# Patient Record
Sex: Male | Born: 1945 | Race: White | Hispanic: No | Smoking: Never smoker
Health system: Southern US, Community
[De-identification: ages and names within clinical notes are randomized; demographics above are authoritative.]

---

## 2020-04-18 IMAGING — CT CT ABDOMEN AND PELVIS WITH CONTRAST
2 series · 13 of 42 positions shown, 15 images · IV contrast (isovue)
Comparison: None at this facility.

CT ABDOMEN AND PELVIS WITH CONTRAST, 04/18/2020 [DATE]: 
A search for DICOM formatted images was conducted for prior CT imaging studies 
completed at a non-affiliated media free facility. 
CLINICAL INDICATION:  Generalized abdominal pain. History of appendectomy. Prior 
history of intussusception.
TECHNIQUE: The abdomen and pelvis was scanned from lung bases through the pubic 
rami with 100 mL of Isovue 300 on a high-resolution CT scanner using dose 
reduction techniques.  Routine MPR reconstructions were performed. The patient's 
eGFR was calculated to be 94 using the i-STAT device.

[Series 4: abd/pel ax w · axial · 0.90mm/px · z∈[+556,+1003]mm · 10 of 171 slices shown, 12 images]
[im 11/171  soft-tissue]
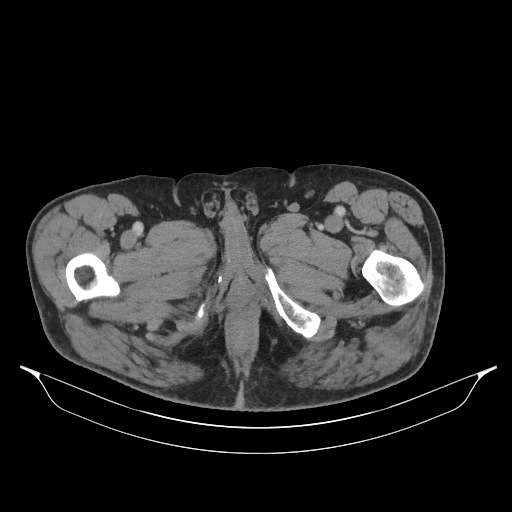
[im 11/171  bone]
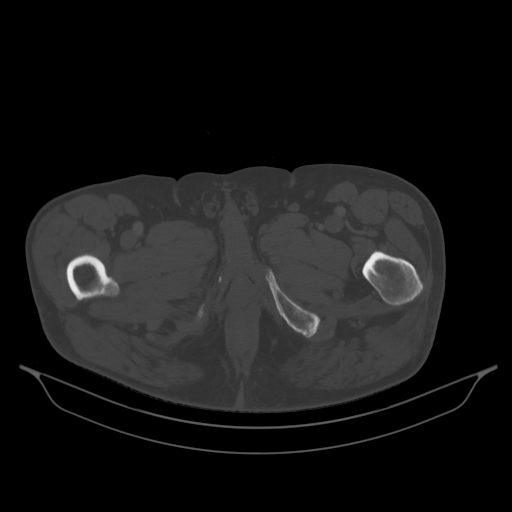
[im 28/171  soft-tissue]
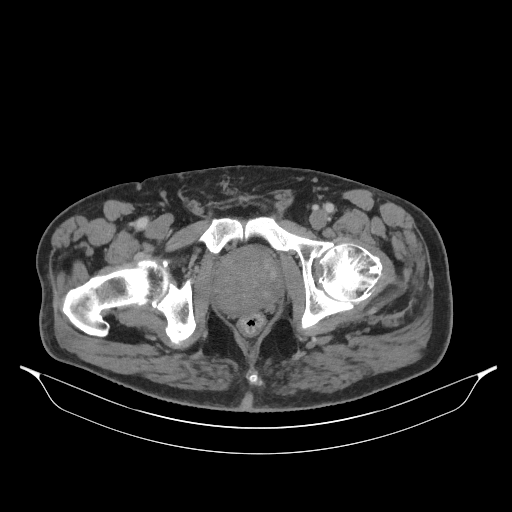
[im 44/171  soft-tissue]
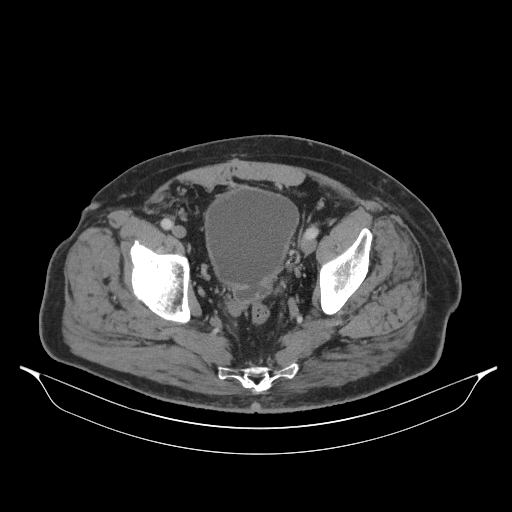
[im 61/171  soft-tissue]
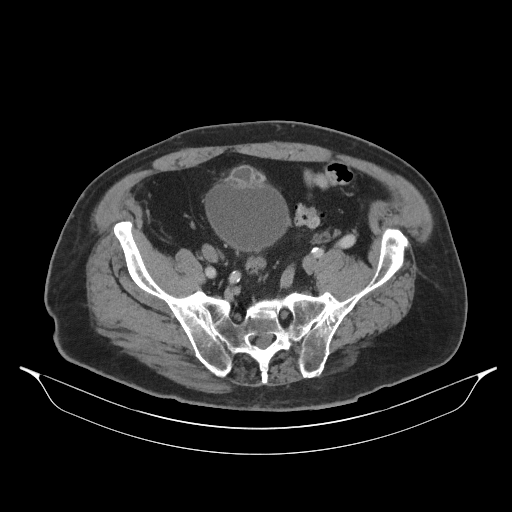
[im 77/171  soft-tissue]
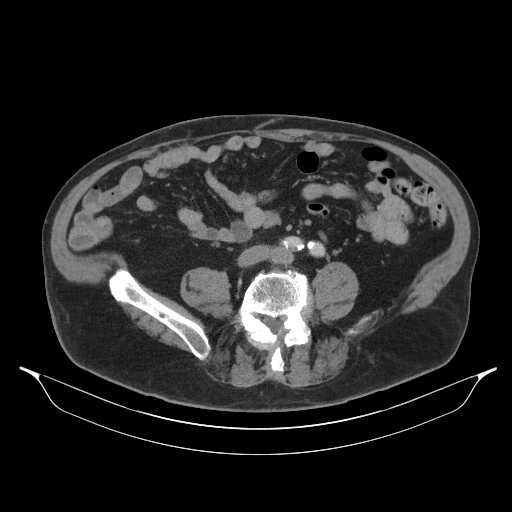
[im 94/171  soft-tissue]
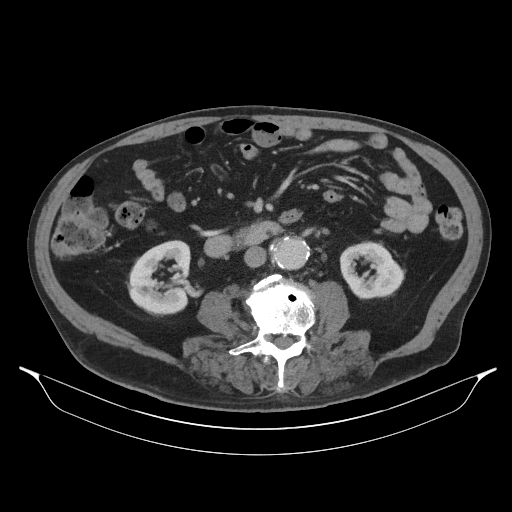
[im 110/171  soft-tissue]
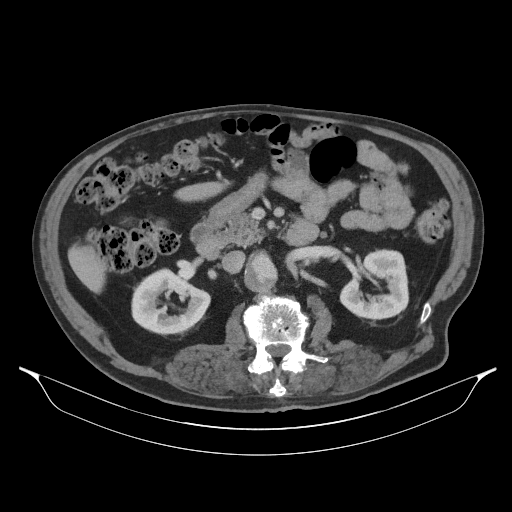
[im 127/171  soft-tissue]
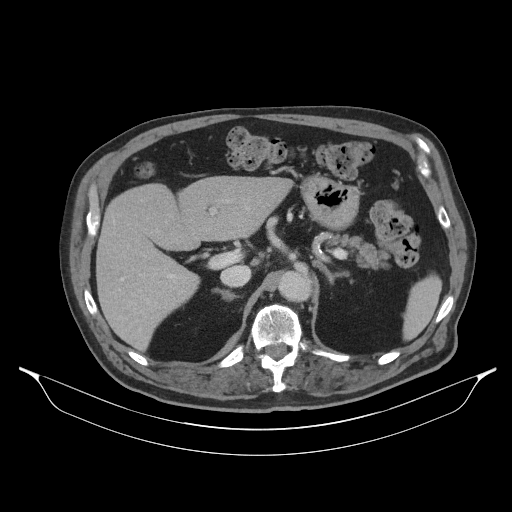
[im 143/171  soft-tissue]
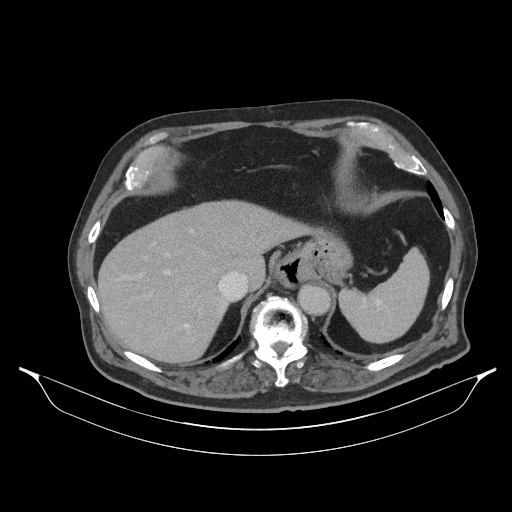
[im 143/171  bone]
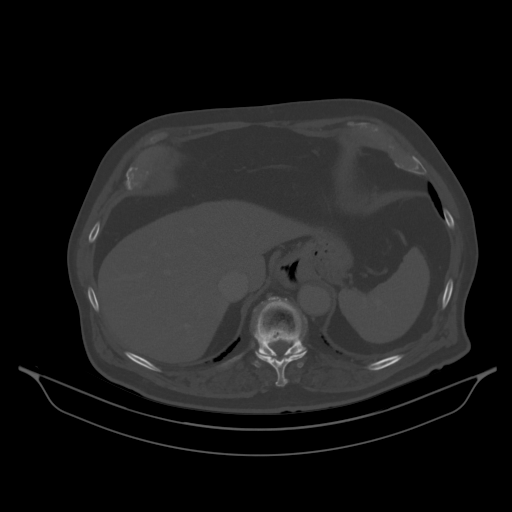
[im 160/171  soft-tissue]
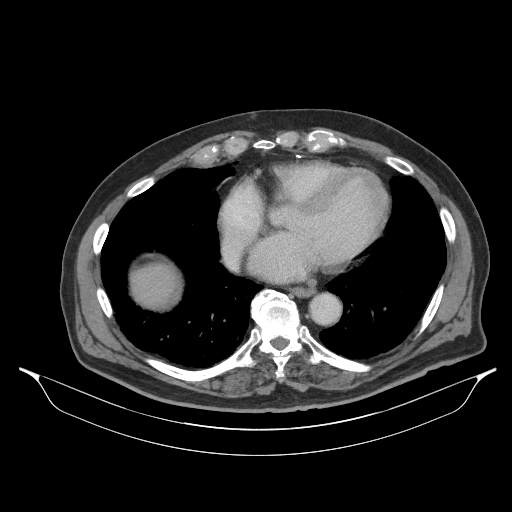

[Series 5: abd/pel cor w · coronal · 0.85mm/px · 3 of 137 slices shown]
[im 46/137  soft-tissue]
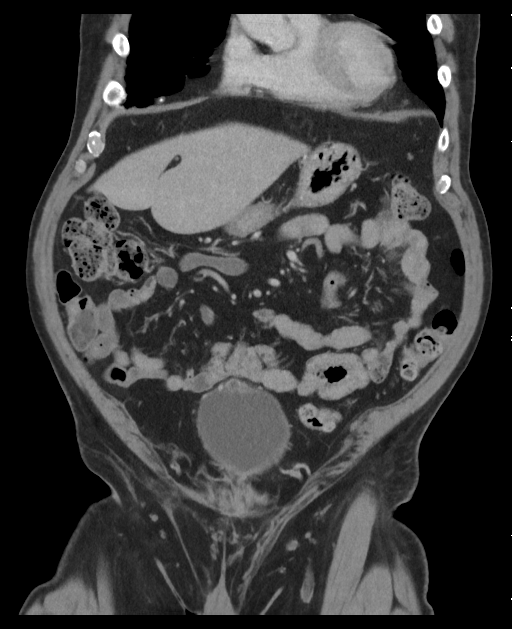
[im 61/137  soft-tissue]
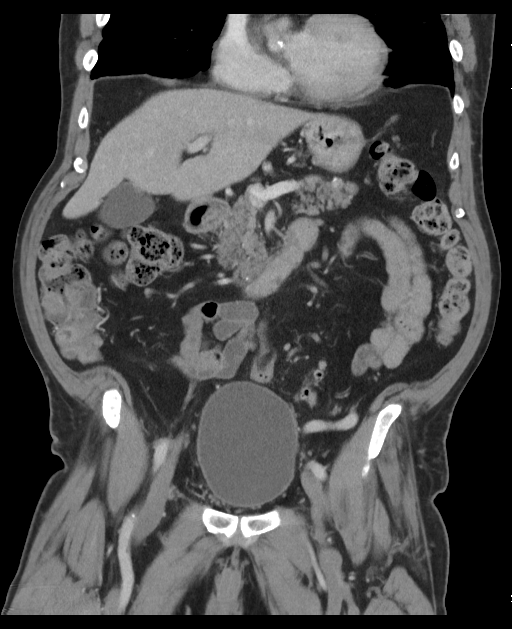
[im 76/137  soft-tissue]
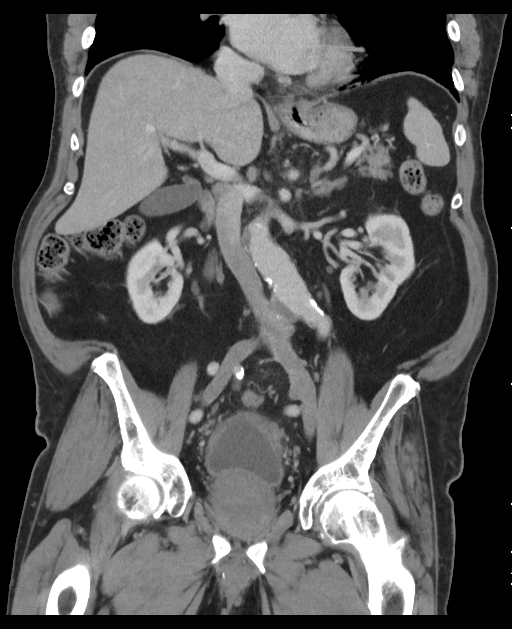

[13 of 42 positions shown; findings below may reference images not displayed]

FINDINGS: Mild diverticulosis is in the distal colon without evidence for diverticulitis. 
There is mildly excessive stool in the colon. The colon is otherwise 
unremarkable. There is a small hiatal hernia. No other gastric pathology is 
found. No abnormalities of the small bowel are detected. The small bowel 
mesentery is within normal limits. The main trunk of the superior mesenteric 
vein and portal vein are patent. There appears to be mild stenosis at the ostium 
of the celiac artery. The superior mesenteric artery shows mild stenosis at its 
ostium and is otherwise widely patent. The IMA appears normally patent. 
The liver appears normal. The gallbladder and biliary tract exhibit no 
abnormalities. The pancreas is normal with the exception of the few punctate 
glandular calcifications in the pancreatic head and uncinate process. The spleen 
is normal. There is mild adrenal thickening and probably a 15 mm adenoma in the 
RIGHT adrenal. There is a 3 mm nonobstructing calculus in the upper pole of the 
RIGHT kidney and a 4 mm nonobstructing calculus also in the lower pole of the 
RIGHT kidney. No LEFT renal calculi are detected. Renal collecting structures 
are decompressed. A subcentimeter cortical cyst is in the anterior midpole 
cortex of the RIGHT kidney. In the LEFT kidney there is a 10 mm isodense bulge 
from the anterior cortex of the lower pole. Statistically this is likely to 
represent a hemorrhagic or proteinaceous cyst however based only on this 
examination a small renal neoplasm is not excluded. The kidneys are otherwise 
unremarkable without acute appearing pathology. Both ureters are decompressed. 
Urinary bladder is normal in overall volume. There are multiple small bladder 
diverticula as well as a 6 mm calculus in one of the posteriorly projecting 
diverticula. The prostate is enlarged measuring 6.3 x 5.2 x 7.4 cm yielding a 
volume of 127 cc. A few dystrophic glandular calcifications are incidentally 
noted in the prostate. Inferior vena cava is normal in caliber. There are 
moderate degrees of atherosclerotic calcification involving the abdominal aorta 
and its branches. There is a small posteriorly projecting saccular aneurysm in 
the infrarenal abdominal aorta measuring 2.5 cm along its base and projecting 16 
mm posteriorly as seen on sagittal image 101. More inferiorly there is a 
rightward projecting saccular aneurysm projecting 19 mm from the aorta and 
measuring 3 cm along its base as seen on coronal image 68. Mild degrees of 
fusiform dilatation are also present measuring up to 3.5 cm. No abnormal 
abdominal or pelvic lymph nodes are detected. There is no free intraperitoneal 
fluid. No significant anterior abdominal wall hernia is identified. Severe 
diffuse degenerative changes are throughout the visible portion of the spine 
with at least moderate and potentially severe degrees of central canal stenosis. 
Limited imaging of the inferior thorax shows cardiomegaly, coronary artery 
calcifications and inferior sternal wires indicating prior heart surgery. There 
are blebs in the posterior base of the RIGHT lower lobe.
IMPRESSION: 1. No acute appearing abdominal or pelvic pathology. There is mildly excessive 
stool in the colon potentially indicating constipation. 
2. There is an indeterminate 10 mm lesion projecting anteriorly from the lower 
pole cortex of LEFT kidney which could represent a hemorrhagic/proteinaceous 
cyst or a small neoplasm. If long-term stability can be proven by comparison to 
prior exams no further evaluation would be needed, otherwise multiphasic 
enhanced CT or MR imaging is recommended for further evaluation. 
3. Nonobstructing RIGHT nephrolithiasis is incidentally noted. The prostate is 
severely enlarged with multiple urinary bladder diverticula and a small urinary 
bladder stone. 
4. Two saccular aneurysms are in the infrarenal abdominal aorta with 
measurements given above. 
5. Severe degenerative changes in the spine with moderate to potentially severe 
degrees of stenosis. 
6. Small hiatal hernia. 
7. Potential small RIGHT adrenal adenoma. 
RADIATION DOSE REDUCTION: All CT scans are performed using radiation dose 
reduction techniques, when applicable.  Technical factors are evaluated and 
adjusted to ensure appropriate moderation of exposure.  Automated dose 
management technology is applied to adjust the radiation doses to minimize 
exposure while achieving diagnostic quality images.

## 2021-10-20 IMAGING — MR MRI ABDOMEN W/WO CONTRAST
14 of 21 series · 24 of 48 positions shown · IV contrast (gadolinium)
Comparison: CT abdomen pelvis April 18, 2020.

________________________________________________________________________________________________ 
MRI ABDOMEN W/WO CONTRAST, 10/20/2021 [DATE]: 
CLINICAL INDICATION: Further evaluation of indeterminate LEFT renal lesion. 
Benign prostatic hyperplasia. Hematuria.
TECHNIQUE: Multiplanar, multiecho position MR images of the abdomen attention 
kidneys were performed without and with intravenous gadolinium enhancement.  9 
mL of Gadavist were injected intravenously. 1 mL of Gadavist was discarded.  
Patient was scanned on a 1.5T magnet. .

[Series 101: survey-head 1st · axial · 15.0mm · 1.76mm/px · 1 of 15 slices shown]
[im 1/15]
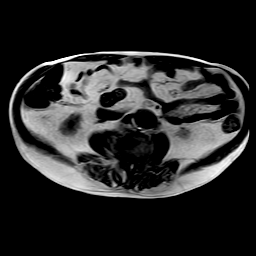

[Series 201: T2 · coronal · 5.0mm · 0.82mm/px · 1 of 32 slices shown]
[im 1/32]
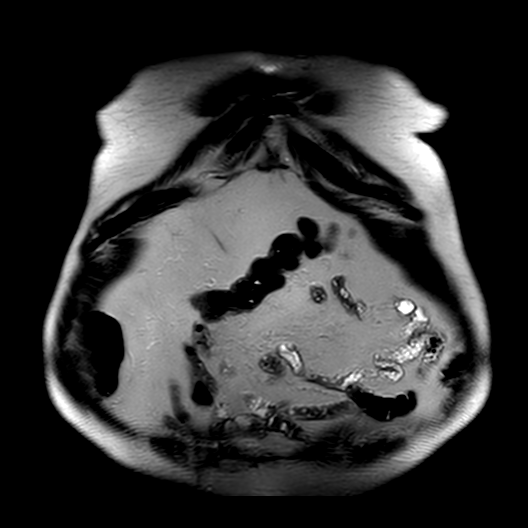

[Series 302: sout of phase · axial · 6.0mm · 1.12mm/px · 1 of 36 slices shown]
[im 1/36]
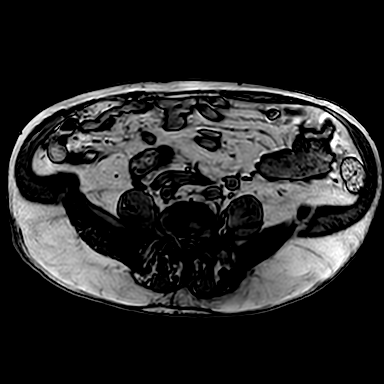

[Series 303: sin phase · axial · 6.0mm · 1.12mm/px · 1 of 36 slices shown]
[im 1/36]
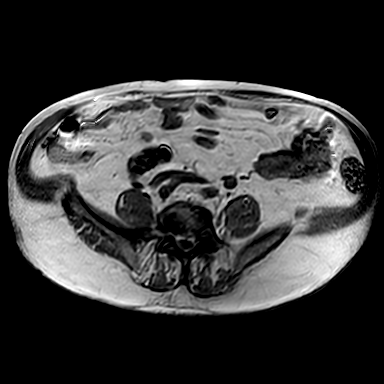

[Series 401: t2_ax_mvxd_hr_rt · axial · 5.0mm · 0.75mm/px · 1 of 40 slices shown]
[im 1/40]
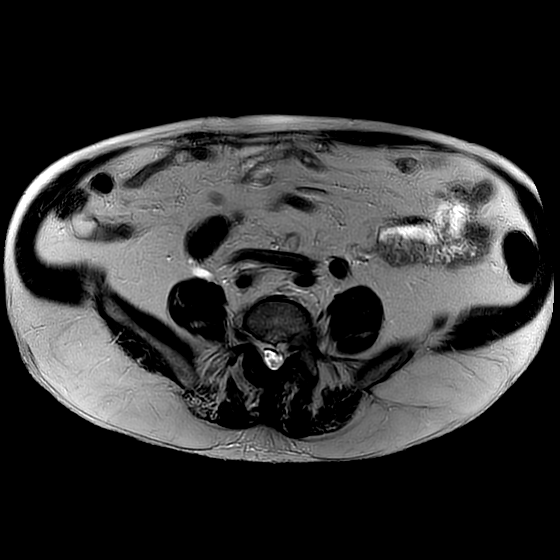

[Series 501: t2_spair mvxd_rt_fast · axial · 5.0mm · 0.88mm/px · 1 of 40 slices shown]
[im 1/40]
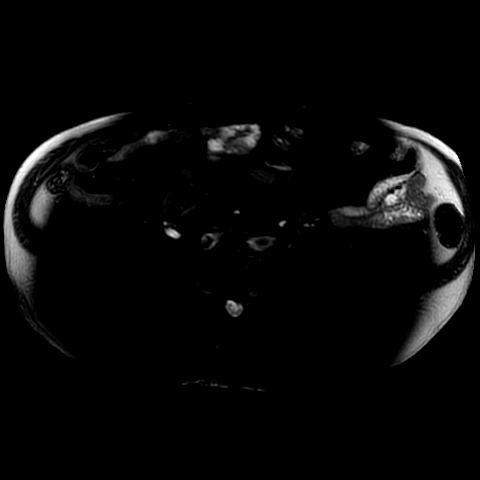

[Series 602: sbo · axial · 5.0mm · 1.68mm/px · 1 of 44 slices shown]
[im 1/44]
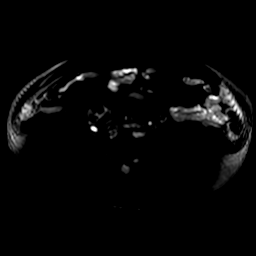

[Series 603: (id) · axial · 5.0mm · 1.68mm/px · 1 of 44 slices shown]
[im 1/44]
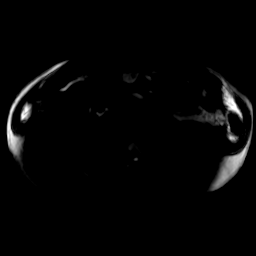

[Series 604: dadc 600 · axial · 5.0mm · 1.68mm/px · 1 of 44 slices shown]
[im 1/44]
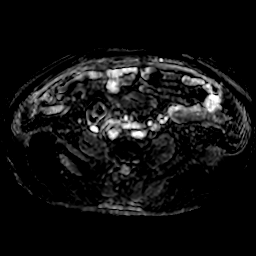

[Series 702: DIXON · axial · 4.0mm · 0.90mm/px · z∈[-94,+144]mm · 3 of 120 slices shown (1 of 5)]
[im 1/120]
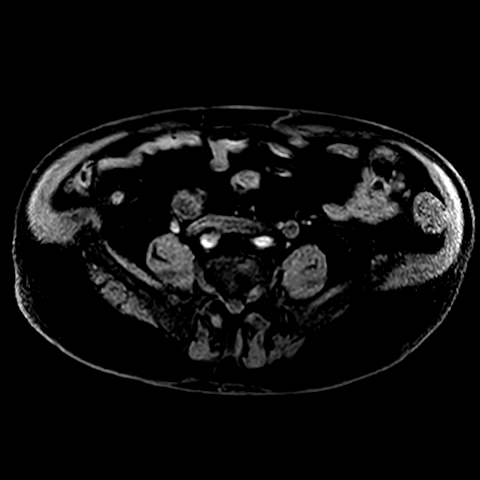
[im 60/120]
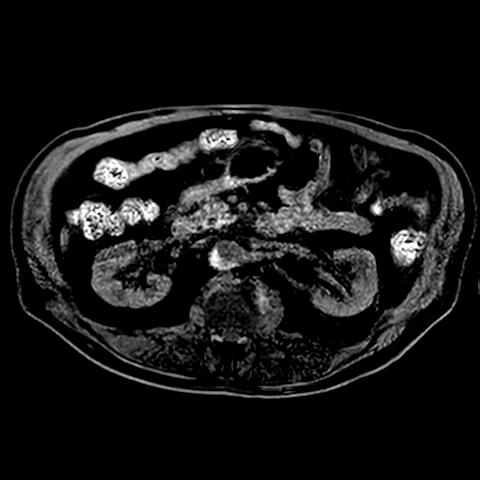
[im 120/120]
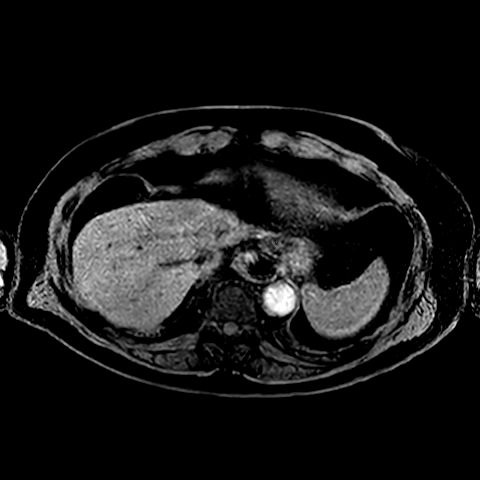

[Series 703: DIXON · axial · 4.0mm · 0.90mm/px · z∈[-94,+144]mm · 3 of 120 slices shown (2 of 5)]
[im 1/120]
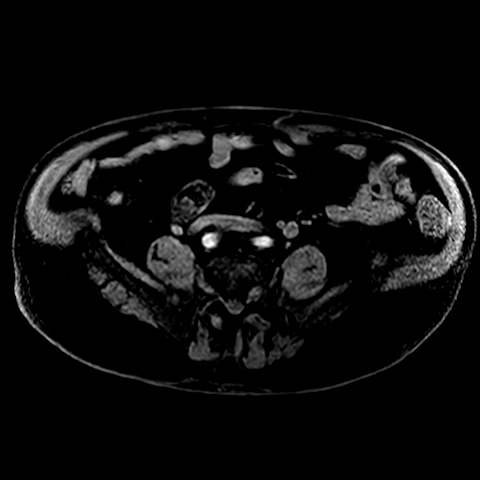
[im 60/120]
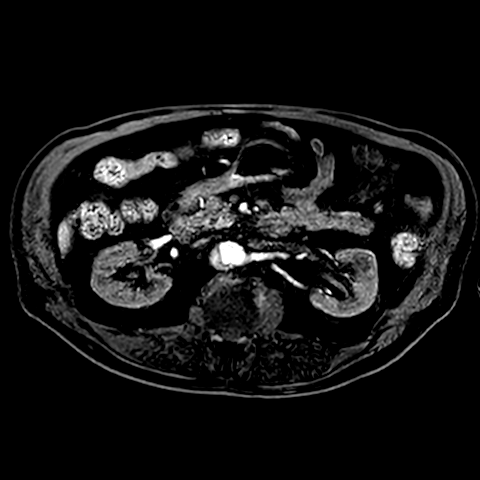
[im 120/120]
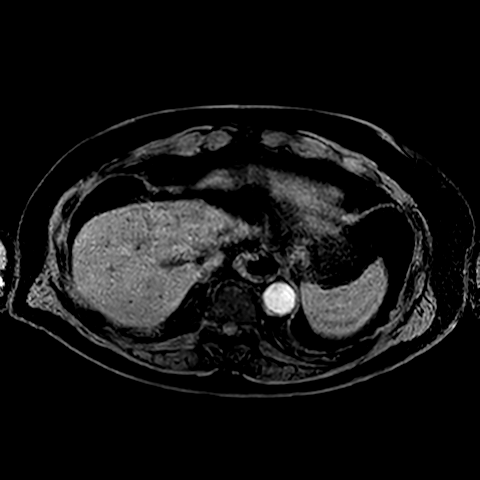

[Series 704: DIXON · axial · 4.0mm · 0.90mm/px · z∈[-94,+144]mm · 3 of 120 slices shown (3 of 5)]
[im 1/120]
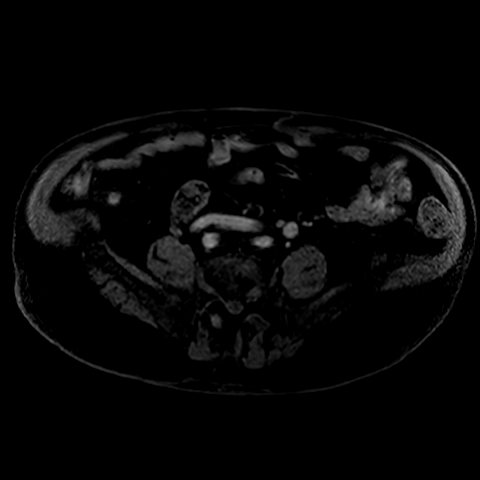
[im 60/120]
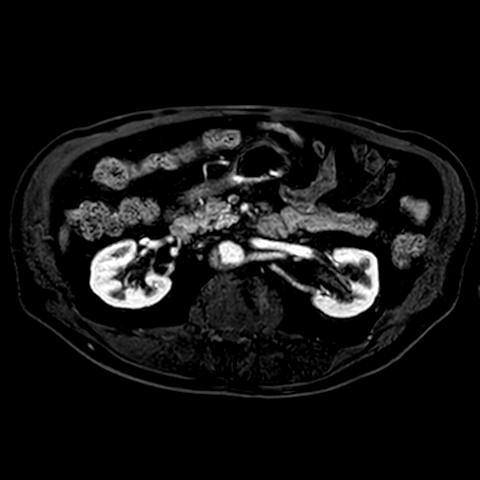
[im 120/120]
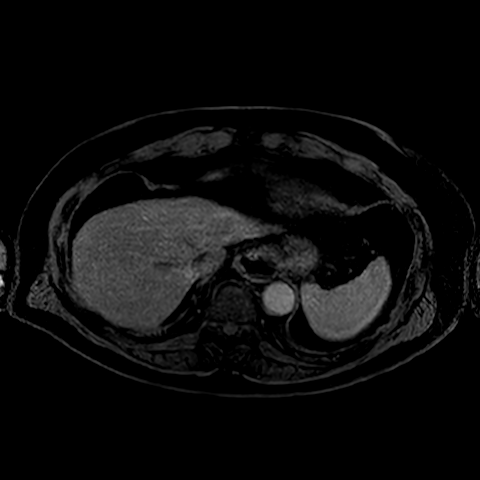

[Series 705: DIXON · axial · 4.0mm · 0.90mm/px · z∈[-94,+144]mm · 3 of 120 slices shown (4 of 5)]
[im 1/120]
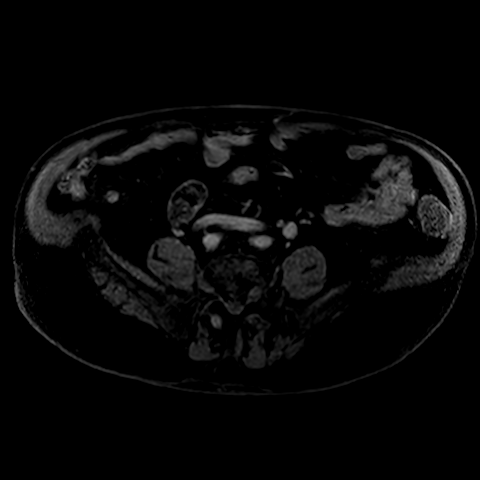
[im 60/120]
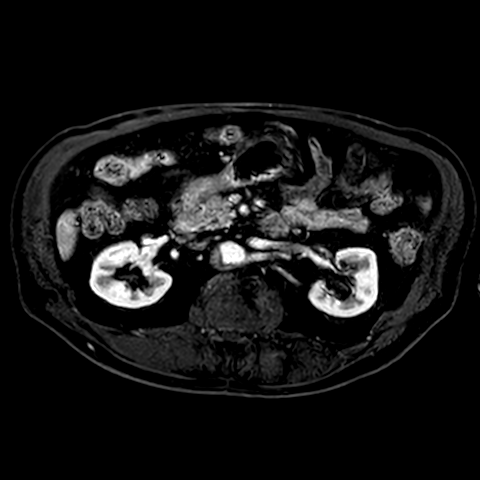
[im 120/120]
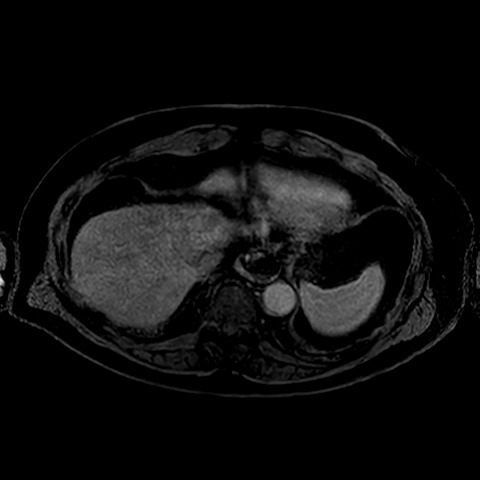

[Series 706: DIXON · axial · 4.0mm · 0.90mm/px · z∈[-94,+64]mm · 3 of 120 slices shown (5 of 5)]
[im 1/120]
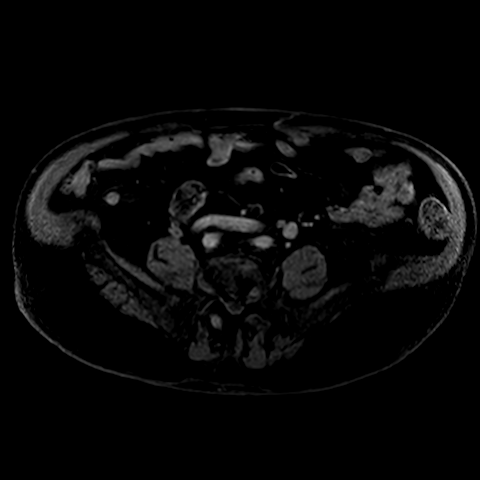
[im 40/120]
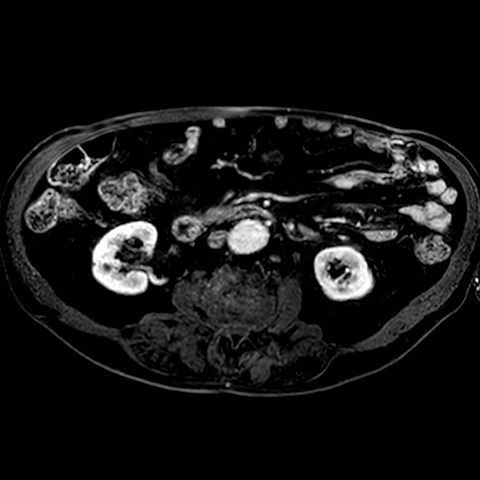
[im 80/120]
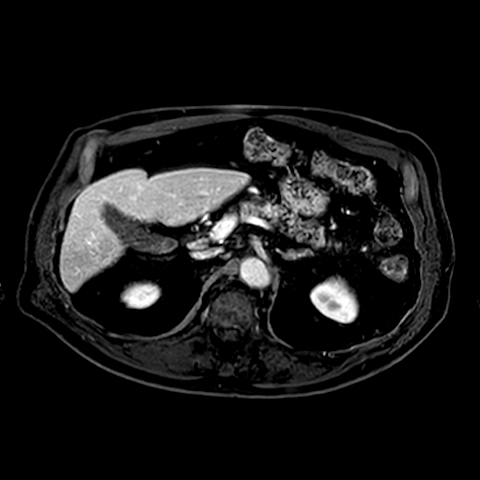

[24 of 48 positions shown; findings below may reference images not displayed]

FINDINGS: There is a 10 mm in maximum dimension bulge along the anterior mid to lower pole 
cortex of the LEFT kidney appearing slightly T2 hyperintense internally showing 
enhancement indicating a small renal mass. It does not appear to have exhibited 
enlargement as compared to the CT performed April 18, 2020. A few small 
bilateral renal cysts are incidentally noted. The kidneys are otherwise 
unremarkable. No hepatic pathology is found. The gallbladder and biliary tract 
are within normal limits. No pancreatic pathology is found. There is a small 
stable RIGHT adrenal adenoma. Left adrenal is within normal limits. There is a 
small hiatal hernia. There is fusiform aneurysmal enlargement of the infrarenal 
abdominal aorta measuring up to 4.4 cm in dimension. Diffuse degenerative 
changes are in the spine. There is mild levoscoliotic curvature in the lumbar 
spine.
IMPRESSION: 1. There is a 10 mm enhancing mass along the anterior cortex of the mid to lower 
pole of the LEFT kidney consistent with a small renal mass. There has been no 
enlargement when compared to April 18, 2020. 
2. Small stable RIGHT adrenal adenoma. 
3. Infrarenal abdominal aortic aneurysm measuring up to 4.4 cm in dimension, 
previously up to 4.1 cm on April 18, 2020. 
4. Small hiatal hernia.

## 2022-02-21 ENCOUNTER — Ambulatory Visit (INDEPENDENT_AMBULATORY_CARE_PROVIDER_SITE_OTHER): Payer: No Typology Code available for payment source | Admitting: Family

## 2022-02-21 ENCOUNTER — Encounter (INDEPENDENT_AMBULATORY_CARE_PROVIDER_SITE_OTHER): Payer: Self-pay

## 2022-02-21 VITALS — BP 116/70 | HR 63 | Temp 97.4°F | Resp 20 | Ht 68.0 in | Wt 200.0 lb

## 2022-02-21 DIAGNOSIS — N3000 Acute cystitis without hematuria: Secondary | ICD-10-CM

## 2022-02-21 LAB — MCKESSON POCT UA 120
Bilirubin UA: NEGATIVE
Glucose UA: NEGATIVE
Ketone UA: NEGATIVE
Nitrite UA: NEGATIVE
Specific Gravity UA: 1.025
Urobilinogen UA: NEGATIVE
pH UA: 6

## 2022-02-21 MED ORDER — CIPROFLOXACIN HCL 250 MG PO TABS
250.0000 mg | ORAL_TABLET | Freq: Two times a day (BID) | ORAL | 0 refills | Status: AC
Start: 2022-02-21 — End: 2022-02-24

## 2022-02-21 NOTE — Progress Notes (Signed)
[facility]  CARE  PROGRESS NOTE     Patient: Jose Campbell   Date: 02/21/2022   MRN: [id number]       Jose Campbell is a 77 y.o. male      HISTORY     History obtained from: Patient    Chief Complaint   Patient presents with    Abdominal Pain     UTI, pain with urinating and frequency        77 y.o male who's currently visiting from out of town presents to the clinic with lower pelvic pain and urinary pressures.  Denies fevers, chills and bodyaches.           Review of Systems   All other systems reviewed and are negative.      History:    Pertinent Past Medical, Surgical, Family and Social History were reviewed.        Current Outpatient Medications:     atorvastatin (LIPITOR) 20 MG tablet, Take 1 tablet (20 mg) by mouth daily, Disp: , Rfl:     valsartan (DIOVAN) 40 MG tablet, Take 1 tablet (40 mg) by mouth daily, Disp: , Rfl:     Allergies   Allergen Reactions    Latex Rash       Medications and Allergies reviewed.    PHYSICAL EXAM     Vitals:    02/21/22 1402   BP: 116/70   BP Site: Right arm   Patient Position: Sitting   Pulse: 63   Resp: 20   Temp: 97.4 F (36.3 C)   TempSrc: Tympanic   SpO2: 96%   Weight: 90.7 kg (200 lb)   Height: 1.727 m ('5\' 8"'$ )       Physical Exam  Constitutional:       General: He is not in acute distress.     Appearance: Normal appearance. He is well-developed.   HENT:      Head: Normocephalic and atraumatic.     Eyes: Conjunctivae are normal. No scleral icterus. Cardiovascular:      Rate and Rhythm: Normal rate.   Pulmonary:      Effort: Pulmonary effort is normal.   Abdominal:      General: Bowel sounds are normal. There is no distension.      Palpations: Abdomen is soft.      Tenderness: There is abdominal tenderness.   Neurological:      Mental Status: He is alert and oriented to person, place, and time.   Skin:     General: Skin is warm.      Coloration: Skin is not pale.   Vitals and nursing note reviewed.         UCC COURSE     There were no labs reviewed with this patient during  the visit.    There were no x-rays reviewed with this patient during the visit.    No current facility-administered medications for this visit.       PROCEDURES     Procedures    MEDICAL DECISION MAKING     History, physical, labs/studies most consistent with UTI as the diagnosis.    Chart Review:  Prior PCP, Specialist and/or ED notes reviewed today: No  Prior labs/images/studies reviewed today: No    Differential Diagnosis: UTI, kidney stone, pyelonephritis    ASSESSMENT     Encounter Diagnosis   Name Primary?    Acute cystitis without hematuria Yes            PLAN  PLAN:       Increase fluid intake     Macrobid prescribed x5 days     Take OTC Azo as needed for symptom management     Urine culture sent for verification     Assessment     Jose Campbell was seen today for abdominal pain.    Diagnoses and all orders for this visit:    Acute cystitis without hematuria  -     UA  -     Urine culture    Other orders  -     ciprofloxacin (CIPRO) 250 MG tablet; Take 1 tablet (250 mg) by mouth 2 (two) times daily for 3 days           Orders Placed This Encounter   Procedures    UA     Requested Prescriptions      No prescriptions requested or ordered in this encounter       Discussed results and diagnosis with patient/family.  Reviewed warning signs for worsening condition, as well as, indications for follow-up with primary care physician and return to urgent care clinic.   Patient/family expressed understanding of instructions.     An After Visit Summary was provided to the patient.

## 2022-02-24 ENCOUNTER — Ambulatory Visit (INDEPENDENT_AMBULATORY_CARE_PROVIDER_SITE_OTHER): Payer: No Typology Code available for payment source | Admitting: Family

## 2022-02-24 ENCOUNTER — Encounter (INDEPENDENT_AMBULATORY_CARE_PROVIDER_SITE_OTHER): Payer: Self-pay

## 2022-02-24 VITALS — BP 162/86 | HR 66 | Temp 98.4°F | Resp 14 | Ht 68.0 in | Wt 201.0 lb

## 2022-02-24 DIAGNOSIS — R309 Painful micturition, unspecified: Secondary | ICD-10-CM

## 2022-02-24 DIAGNOSIS — N3001 Acute cystitis with hematuria: Secondary | ICD-10-CM

## 2022-02-24 DIAGNOSIS — L0291 Cutaneous abscess, unspecified: Secondary | ICD-10-CM

## 2022-02-24 DIAGNOSIS — B353 Tinea pedis: Secondary | ICD-10-CM

## 2022-02-24 LAB — MCKESSON POCT UA 120
Bilirubin UA: NEGATIVE
Glucose UA: NEGATIVE
Ketone UA: NEGATIVE
Leukocytes UA: NEGATIVE
Nitrite UA: NEGATIVE
Specific Gravity UA: 1.02
Urobilinogen UA: NEGATIVE
pH UA: 6

## 2022-02-24 MED ORDER — CLOTRIMAZOLE 1 % EX CREA
TOPICAL_CREAM | Freq: Two times a day (BID) | CUTANEOUS | 0 refills | Status: AC
Start: 2022-02-24 — End: 2022-03-26

## 2022-02-24 MED ORDER — CEFDINIR 300 MG PO CAPS
300.0000 mg | ORAL_CAPSULE | Freq: Two times a day (BID) | ORAL | 0 refills | Status: AC
Start: 2022-02-24 — End: 2022-03-03

## 2022-02-24 NOTE — Progress Notes (Signed)
[facility]  URGENT  CARE  PROGRESS NOTE     Patient: Jose Campbell   Date: 02/24/2022   MRN: [id number]       Jose Campbell is a 77 y.o. male      HISTORY     History obtained from: Patient    Chief Complaint   Patient presents with    Urinary Tract Infection Symptoms     Onset - seen Friday for UTI, symptoms still present    Wound Check     Onset - yesterday, scar on chest irritation, rash on left foot        HPI: 77 year old male history of hypertension, high cholesterol presents to urgent care clinic with 3 complaints.    Initial complaint involves urinary burning, frequency, and urgency for the last week.  Was seen at this clinic 3 days ago and diagnosed with UTI.  Was prescribed Cipro for 3 days which he completed but states symptoms still persist.  Denies any flank pain, abdominal pain, fever, testicular pain, penile lesions, penile discharge.    Second complaint involves area of redness and swelling to left chest area.  Has been also ongoing for a week and started draining purulent drainage today.  Denies any chest pain, shortness of breath, or history of diabetes.    Third complaint involves itchy rash to left foot that has been ongoing for 2 weeks.  Rash appears scaly and appears to have dry skin to area.  Denies any discharge, numbness, tingling, extremity weakness, or injury to the foot.    Review of Systems  as above    History:    Pertinent Past Medical, Surgical, Family and Social History were reviewed.        Current Outpatient Medications:     atorvastatin (LIPITOR) 20 MG tablet, Take 1 tablet (20 mg) by mouth daily, Disp: , Rfl:     valsartan (DIOVAN) 40 MG tablet, Take 1 tablet (40 mg) by mouth daily, Disp: , Rfl:     cefdinir (OMNICEF) 300 MG capsule, Take 1 capsule (300 mg) by mouth 2 (two) times daily for 7 days, Disp: 14 capsule, Rfl: 0    ciprofloxacin (CIPRO) 250 MG tablet, Take 1 tablet (250 mg) by mouth 2 (two) times daily for 3 days (Patient not taking: Reported on 02/24/2022), Disp: 6 tablet,  Rfl: 0    clotrimazole (LOTRIMIN) 1 % cream, Apply topically 2 (two) times daily, Disp: 85 g, Rfl: 0    Allergies   Allergen Reactions    Sulfa Antibiotics Hives    Latex Rash       Medications and Allergies reviewed.    PHYSICAL EXAM     Vitals:    02/24/22 0858   BP: 162/86   Pulse: 66   Resp: 14   Temp: 98.4 F (36.9 C)   SpO2: 99%   Weight: 91.2 kg (201 lb)   Height: 1.727 m ('5\' 8"'$ )       Physical Exam  Constitutional:       General: He is not in acute distress.     Appearance: Normal appearance. He is well-developed.   HENT:      Head: Normocephalic and atraumatic.      Nose: Nose normal.      Mouth/Throat:      Mouth: Mucous membranes are moist.      Pharynx: No oropharyngeal exudate.     Eyes: Conjunctivae and EOM are normal. Pupils are equal, round, and reactive to light. No scleral  icterus. Neck:      Thyroid: No thyroid mass or thyromegaly.      Vascular: No carotid bruit.   Cardiovascular:      Rate and Rhythm: Normal rate and regular rhythm.      Pulses: Normal pulses.      Heart sounds: Normal heart sounds. No murmur heard.  Pulmonary:      Effort: Pulmonary effort is normal. No respiratory distress.      Breath sounds: Normal breath sounds. No wheezing.   Abdominal:      General: Bowel sounds are normal. There is no distension.      Palpations: Abdomen is soft. There is no mass.      Tenderness: There is no abdominal tenderness. There is no right CVA tenderness or left CVA tenderness.   Musculoskeletal:         General: Normal range of motion.      Cervical back: Normal range of motion and neck supple.      Right lower leg: Normal.      Left lower leg: Normal.      Right ankle: Normal.      Left ankle: Normal.      Right foot: Normal.      Left foot: No swelling, deformity or tenderness.      Comments: Dry, hyperkeratotic patches noted to dorsum of patient's left foot and areas between toes.  No overt tenderness, warmth, lymphatic streaking, induration, vesicles, purpura, or petechia noted.  Appears  to be tinea pedis.   Lymphadenopathy:      Cervical: No cervical adenopathy.   Neurological:      Mental Status: He is alert and oriented to person, place, and time.      Cranial Nerves: No cranial nerve deficit.      Sensory: No sensory deficit.      Gait: Gait normal.      Deep Tendon Reflexes: Reflexes are normal and symmetric.   Skin:     General: Skin is warm and dry.      Findings: Rash present.   Psychiatric:         Mood and Affect: Mood normal.         Speech: Speech normal.         Behavior: Behavior normal.   Chest:      Chest wall: Tenderness present.          Comments: 1 cm diameter erythematous, indurated, papular lesion noted to left chest.  Negative for lymphatic streaking.  Draining slightly purulent drainage.  Vitals and nursing note reviewed.          UCC COURSE     LABS  The following POCT tests were ordered, reviewed and discussed with the patient/family.     Results       Procedure Component Value Units Date/Time    UA [id number]  (Abnormal) Collected: 02/24/22 0915    Specimen: Clean Catch Updated: 02/24/22 0915     Color, UA Yellow     Clarity, UA Clear     Leukocytes UA Negative     Nitrite UA Negative     Urobilinogen UA Negative (0.2 mg/dl)     Protein UA Trace ('15mg'$ /dl)     pH UA 6.0     Blood UA Trace (10 Ery/ul)     Specific Gravity UA 1.020     Ketone UA Negative     Bilirubin UA Negative     Glucose UA Negative  There were no x-rays reviewed with this patient during the visit.    No current facility-administered medications for this visit.       PROCEDURES     Procedures    MEDICAL DECISION MAKING     History, physical, labs/studies most consistent with acute UTI / tinea pedis / skin abscess as the diagnosis.        Chart Review:  Prior PCP, Specialist and/or ED notes reviewed today: Yes  Prior labs/images/studies reviewed today: Yes    Differential Diagnosis: UTI, pyelonephritis, renal colic, prostatitis, appendicitis, diverticulitis, bladder CA, STI, tinea pedis,  cellulitis, eczema, psoriasis, skin abscess, cellulitis        ASSESSMENT     Encounter Diagnoses   Name Primary?    Painful urination     Acute cystitis with hematuria Yes    Cutaneous abscess, unspecified site     Tinea pedis of left foot                 PLAN      PLAN: VSS, afebrile, nontachy, appears to be in no acute distress, lung sounds clear, abdomen soft and nontender palpation.  TM's pearly gray, oropharynx nonerythematous and not exudative.  Is nontoxic-appearing.    Erythematous papular lesion to left chest appears to be skin abscess that is already draining.  Area further drained with manual pressure and patient tolerated procedure well.  Advised warm compresses to area.    Rash to left foot appears to be from tinea pedis.  Concern for cellulitis is low.  Will patient a course of clotrimazole cream to help with symptoms.    Urinalysis still indicative of UTI slight hematuria and urine.  Urine culture repeated.  Chart review shows no growth from previous urinalysis.  Will put patient a course of cefdinir and strongly encourage follow-up with PCP regarding symptoms.     Cefdinir will also cover for skin abscess to left chest.    Strict ER precautions discussed.  Advised follow up with PCP.  Indicated understanding and agreed to treatment plan.              Orders Placed This Encounter   Procedures    Urine culture    UA     Requested Prescriptions     Signed Prescriptions Disp Refills    cefdinir (OMNICEF) 300 MG capsule 14 capsule 0     Sig: Take 1 capsule (300 mg) by mouth 2 (two) times daily for 7 days    clotrimazole (LOTRIMIN) 1 % cream 85 g 0     Sig: Apply topically 2 (two) times daily       Discussed results and diagnosis with patient/family.  Reviewed warning signs for worsening condition, as well as, indications for follow-up with primary care physician and return to urgent care clinic.   Patient/family expressed understanding of instructions.     An After Visit Summary was provided to the  patient.

## 2022-02-24 NOTE — Patient Instructions (Signed)
You were seen in clinic today for an acute urinary tract infection.  Please take antibiotics as prescribed.  Please take over-the-counter Tylenol as needed for discomfort.  Rest, home, fluids.    You had a skin abscess to your chest that was already draining.  Apply warm compresses to area to further help with symptoms.    Rash to your foot appears to be from athlete's foot.  Please take clotrimazole cream as instructed.    Please return or go to the ER for any new or worsening symptoms that concern you.  Follow-up with your family doctor in 3 to 5 days.

## 2022-04-28 IMAGING — CT CTA ABDOMEN WITH BILATERAL RUNOFF
3 of 4 series · 13 of 32 positions shown, 18 images · IV contrast (APPLIED)
Comparison: CT 04/18/2020.

________________________________________________________________________________________________ 
CTA ABDOMEN WITH BILATERAL RUNOFF, 04/28/2022 [DATE]: 
CLINICAL INDICATION: Abdominal aortic aneurysm. 
A search for DICOM formatted images was conducted for prior CT imaging studies 
completed at a non-affiliated media free facility.
TECHNIQUE: The lung bases through the feet were scanned with 150 mL of Isovue 
370 MDV intravenously on a high-resolution CT scanner using dose reduction 
techniques. 0 ml of  Isovue 370 MDV were discarded. Routine MPR and MIP 3D 
renderings were reconstructed on an independent workstation with concurrent 
physician supervision. The patients eGFR was calculated to be 93.5 mL/min/1.73 
m2 using the i-STAT device. Count of known CT and Cardiac Nuclear Medicine 
studies performed in the previous 12 months = 0.

[Series 4: runoff 1.5 b31s · axial · 0.93mm/px · z∈[-1275,-216]mm · 7 of 942 slices shown, 12 images (1 of 2)]
[im 118/942  soft-tissue]
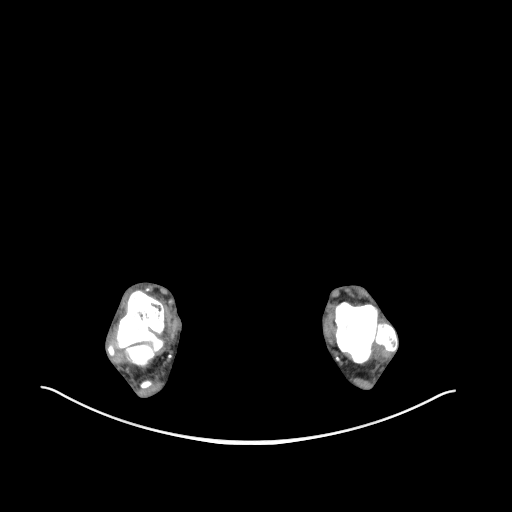
[im 118/942  bone]
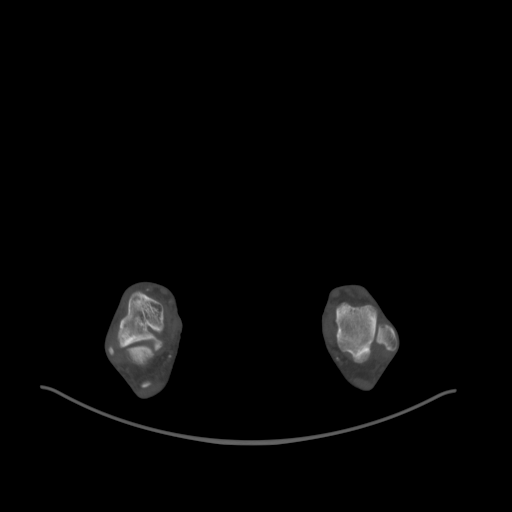
[im 236/942  soft-tissue]
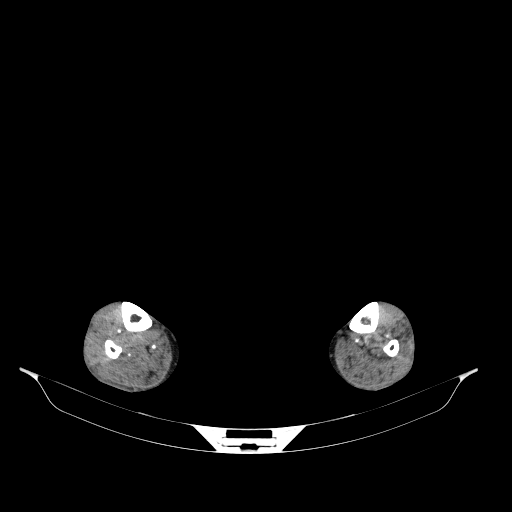
[im 353/942  soft-tissue]
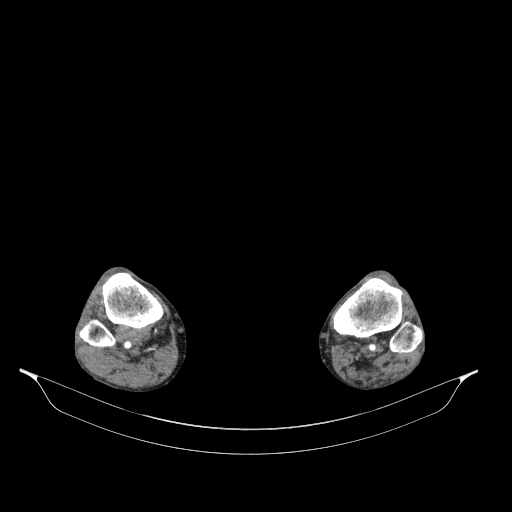
[im 471/942  soft-tissue]
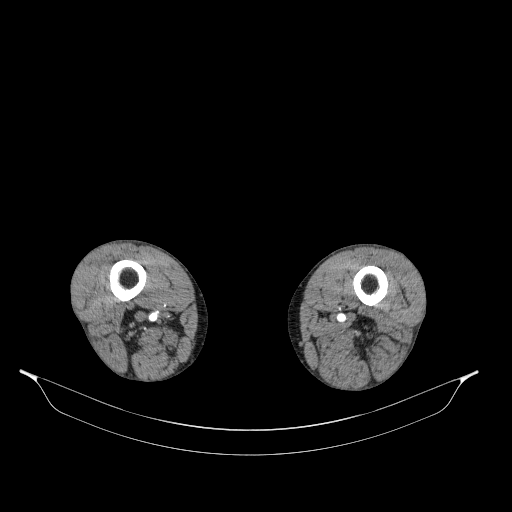
[im 471/942  lung]
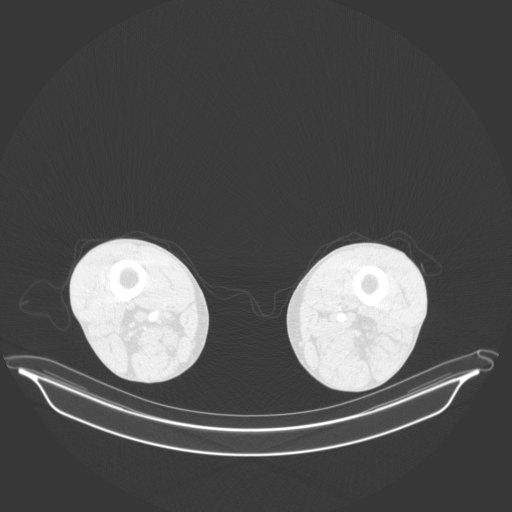
[im 589/942  soft-tissue]
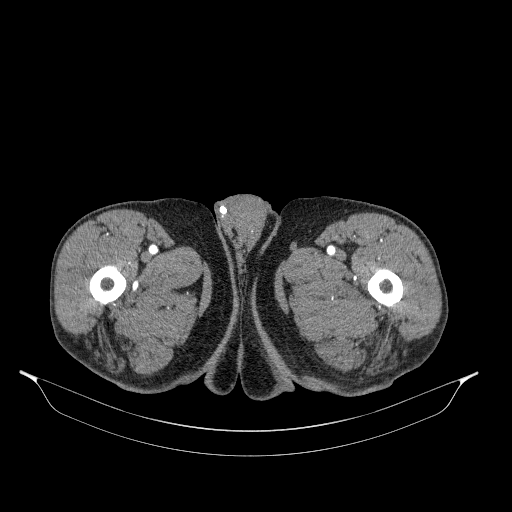
[im 589/942  lung]
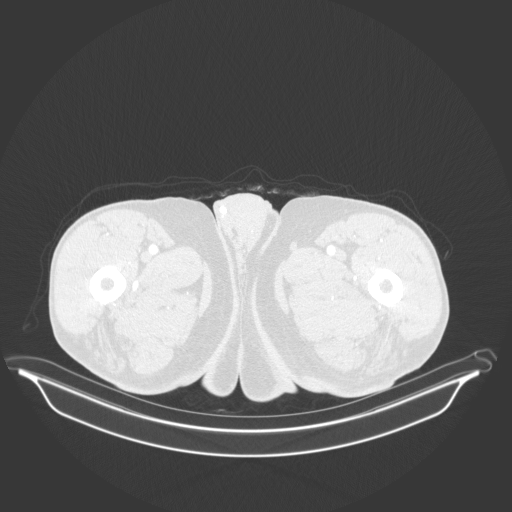
[im 706/942  soft-tissue]
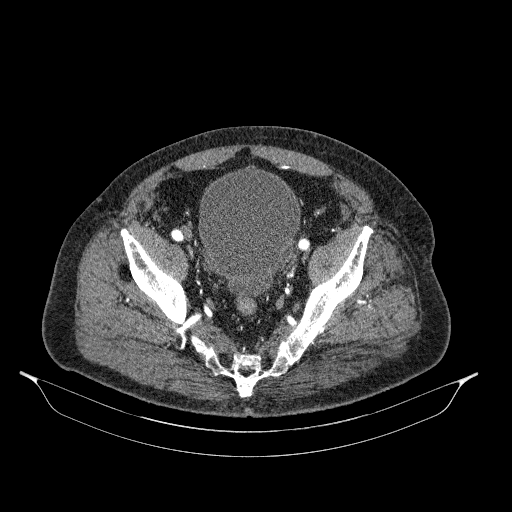
[im 706/942  lung]
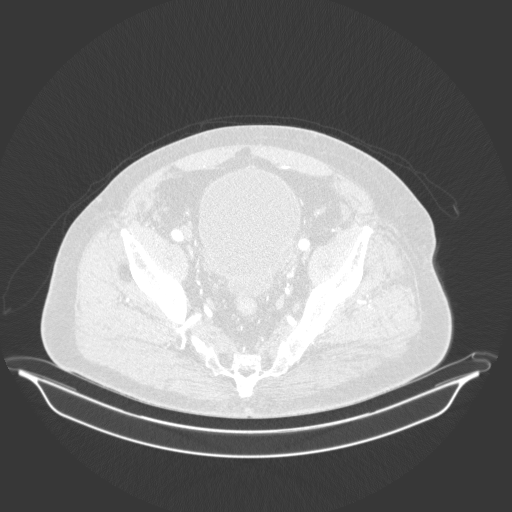
[im 824/942  soft-tissue]
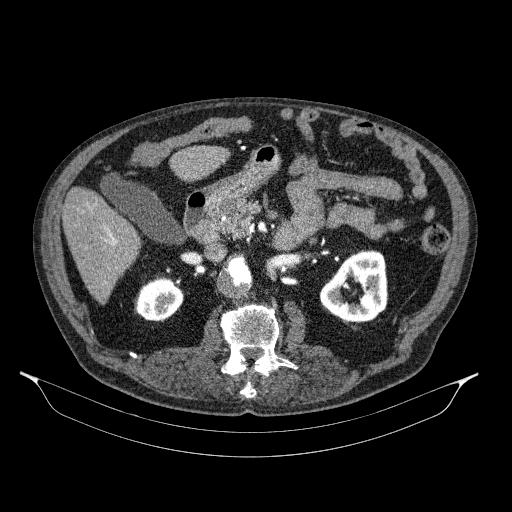
[im 824/942  lung]
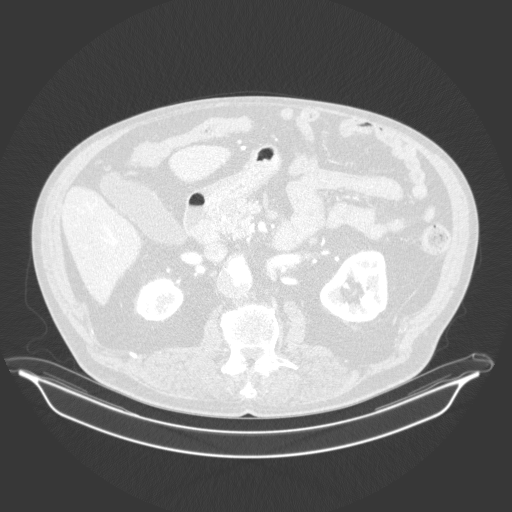

[Series 8: runoff 1.5 b31s · axial · 0.93mm/px · z∈[-1226,-1001]mm · 2 of 452 slices shown (2 of 2)]
[im 151/452  soft-tissue]
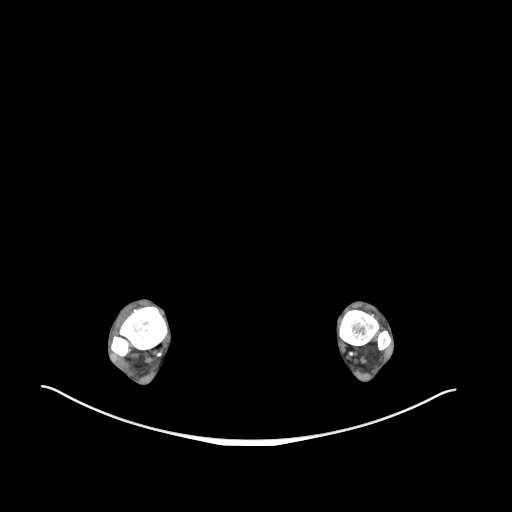
[im 301/452  soft-tissue]
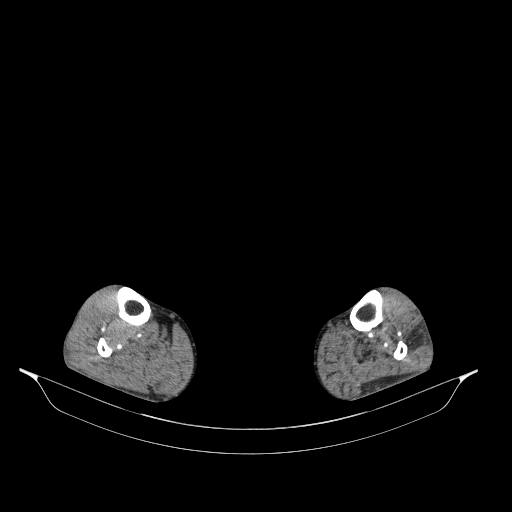

[Series 11: vitrea · axial · 0.93mm/px · z∈[-1316,-910]mm · 4 of 678 slices shown]
[im 136/678  soft-tissue]
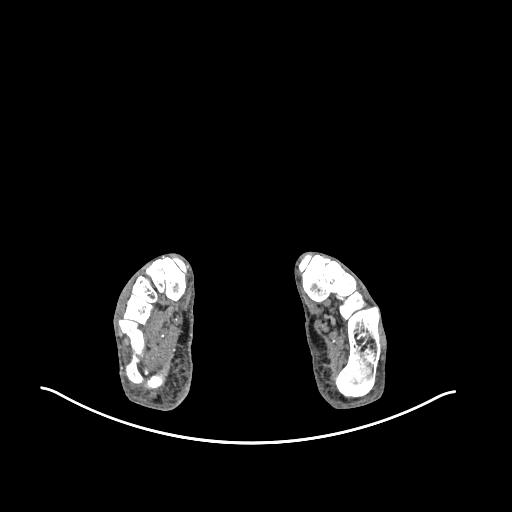
[im 271/678  soft-tissue]
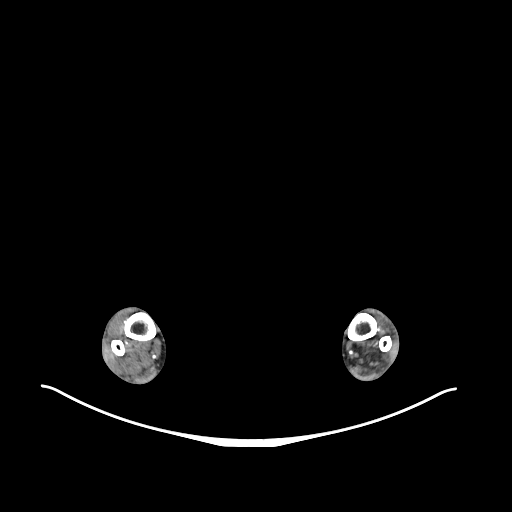
[im 407/678  soft-tissue]
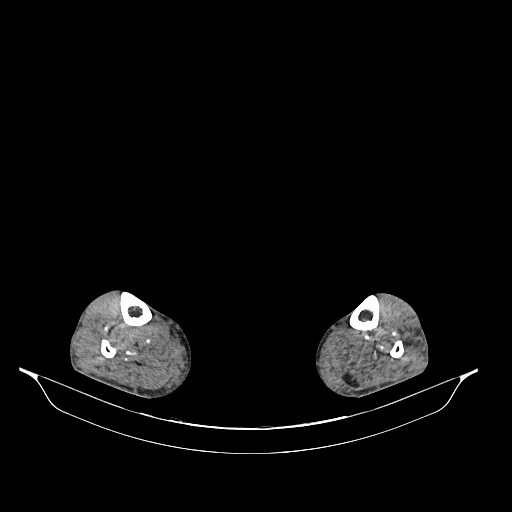
[im 542/678  soft-tissue]
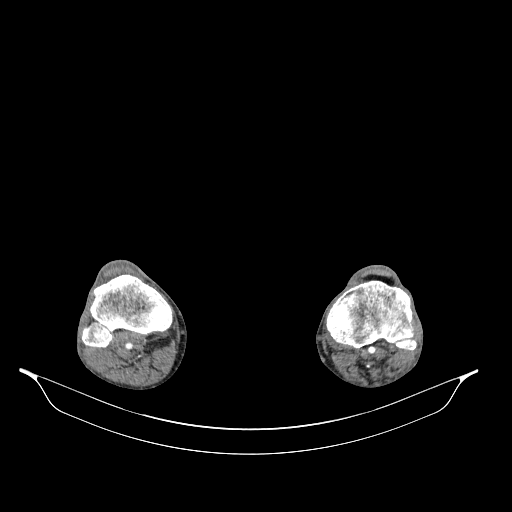

[13 of 32 positions shown; findings below may reference images not displayed]

FINDINGS: ABDOMINAL AORTA/ILIOFEMORAL/RUNOFF: Abdominal aortic aneurysm measuring 
approximately 4.4 cm. No dissection. The aorta is ectatic in course. Iliofemoral 
vessels are ectatic in course without significant focal stenosis or evidence of 
aneurysm. Mild atherosclerotic plaque is noted within a superficial femoral and 
popliteal arteries but no significant focal stenosis. There is three-vessel 
runoff bilaterally with posterior tibial arteries being the dominant runoff 
vessels.  
MESENTERIC ARTERIES: No significant mesenteric stenosis or aneurysm. 
RENAL ARTERIES: No significant renal artery stenosis or aneurysm. 
LUNG BASES: Clear. 
LIVER: Fatty infiltration. No mass.  No intrahepatic biliary dilatation. 
GALLBLADDER: No wall thickening.  No stones. 
COMMON BILE DUCT: Normal caliber.  No stones. 
SPLEEN: Within normal limits. 
PANCREAS: No mass.  No pancreatic fluid collections. 
ADRENALS: Right adrenal adenoma. No left adrenal mass.. 
KIDNEYS: Stable 10 mm enhancing mass anterior cortex junction midportion lower 
pole left kidney. No new renal masses..  No hydronephrosis. 
LYMPH NODES: No adenopathy. 
STOMACH, SMALL BOWEL AND COLON: No bowel wall thickening or obstruction. 
PERITONEAL CAVITY: No mesenteric stranding or free fluid. 
OSSEOUS STRUCTURES: No acute fracture or destructive lesion. 
PELVIC ORGANS: Stable prostatomegaly and mild bladder wall thickening..
IMPRESSION: Stable infrarenal abdominal aortic aneurysm measuring 4.4 cm in its greatest 
dimension. 
No significant focal iliofemoral runoff stenosis. No iliofemoral aneurysm. 
Stable small enhancing mass left kidney. 
Stable right adrenal adenoma  
RADIATION DOSE REDUCTION: All CT scans are performed using radiation dose 
reduction techniques, when applicable.  Technical factors are evaluated and 
adjusted to ensure appropriate moderation of exposure.  Automated dose 
management technology is applied to adjust the radiation doses to minimize 
exposure while achieving diagnostic quality images.

## 2022-09-11 IMAGING — MR MRI ABDOMEN W/WO CONTRAST
14 of 21 series · 22 of 48 positions shown · IV contrast (gadavist)
Comparison: MRI from October 2021 and CT scan from April 2022

________________________________________________________________________________________________ 
MRI ABDOMEN W/WO CONTRAST, 09/11/2022 [DATE]: 
CLINICAL INDICATION: Follow-up left renal lesion.
TECHNIQUE: Multiplanar, multiecho position MR images of the abdomen were 
performed without and with intravenous enhancement. 9 mL of Gadavist were 
injected intravenously by hand. 1 mL of Gadavist discarded. Patient was scanned 
on a 1.5T magnet.

[Series 101: survey-head 1st · axial · 15.0mm · 1.76mm/px · 1 of 15 slices shown]
[im 1/15]
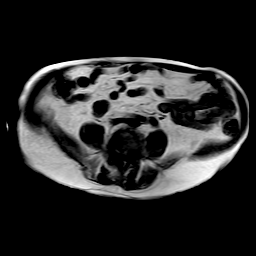

[Series 201: T2 · coronal · 5.5mm · 0.66mm/px · 1 of 34 slices shown]
[im 1/34]
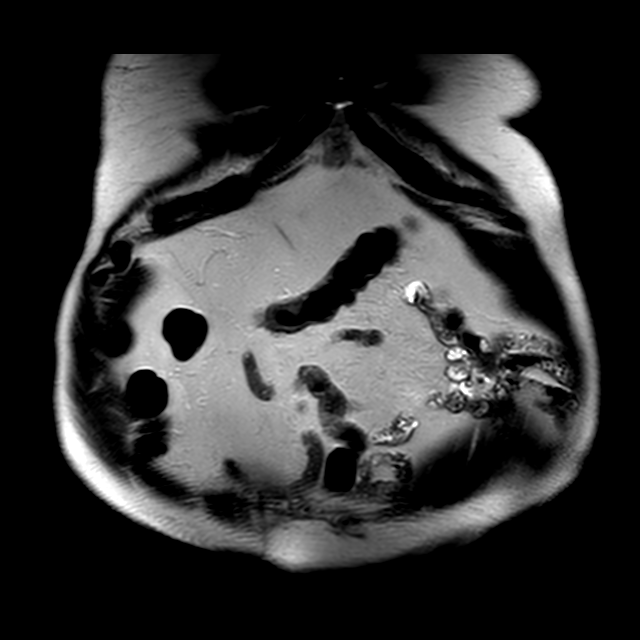

[Series 302: sout of phase · axial · 6.0mm · 1.12mm/px · 1 of 36 slices shown]
[im 1/36]
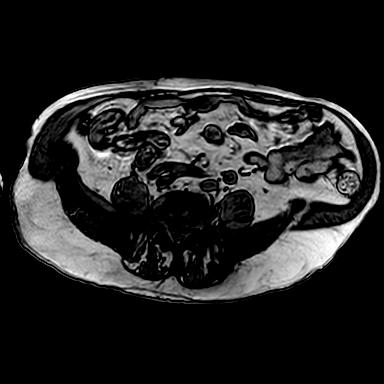

[Series 303: sin phase · axial · 6.0mm · 1.12mm/px · 1 of 36 slices shown]
[im 1/36]
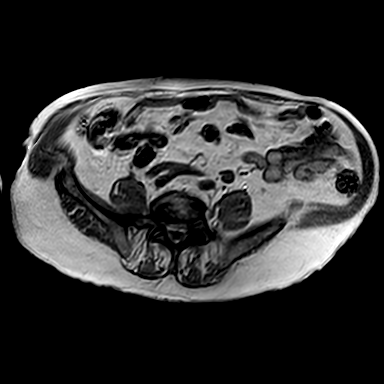

[Series 401: t2_ax_mvxd_hr_rt · axial · 5.0mm · 0.67mm/px · 1 of 40 slices shown]
[im 1/40]
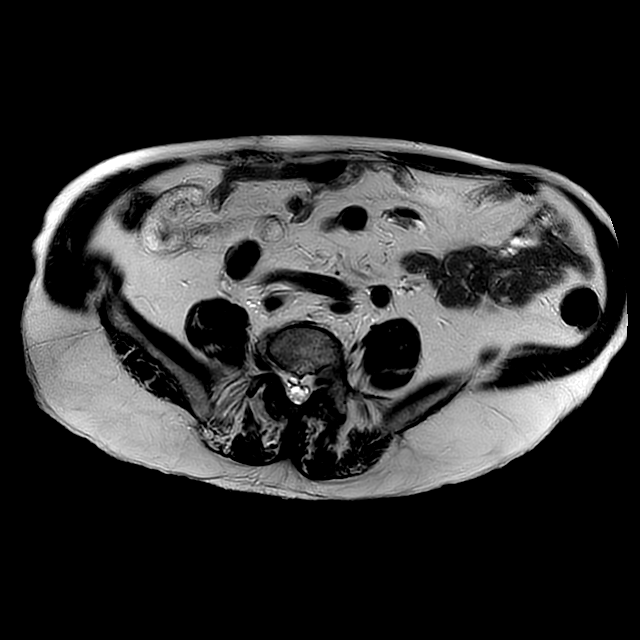

[Series 501: t2_spair mvxd_rt_fast · axial · 5.0mm · 0.84mm/px · 1 of 40 slices shown]
[im 1/40]
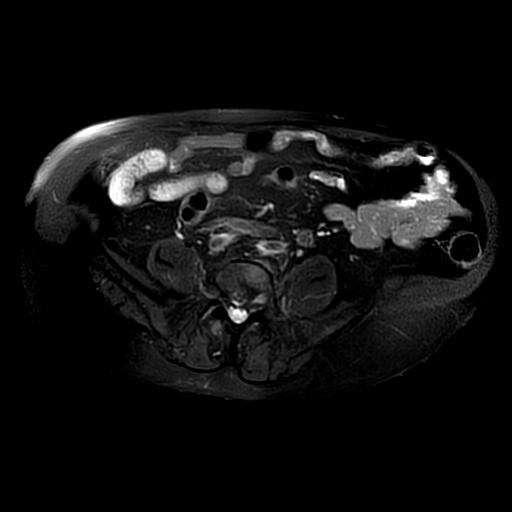

[Series 602: sbo · axial · 5.0mm · 1.68mm/px · 1 of 44 slices shown]
[im 1/44]
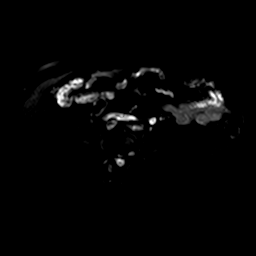

[Series 603: (id) · axial · 5.0mm · 1.68mm/px · 1 of 44 slices shown]
[im 1/44]
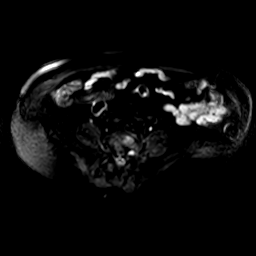

[Series 604: dadc 600 · axial · 5.0mm · 1.68mm/px · 1 of 44 slices shown]
[im 1/44]
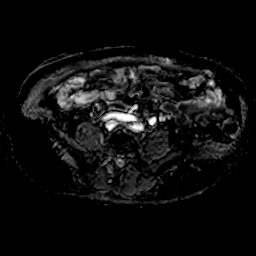

[Series 702: DIXON · axial · 4.0mm · 0.84mm/px · z∈[-61,+177]mm · 3 of 120 slices shown (1 of 5)]
[im 1/120]
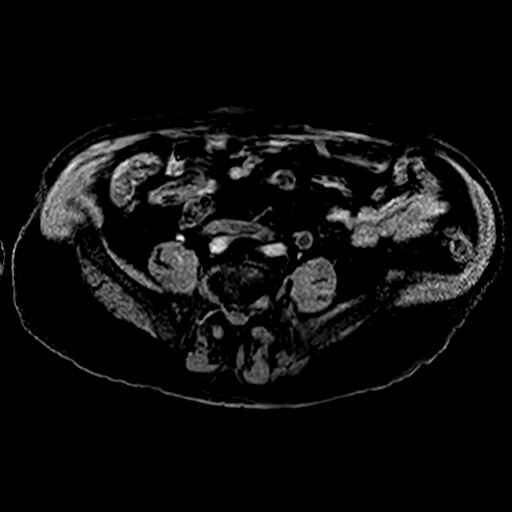
[im 60/120]
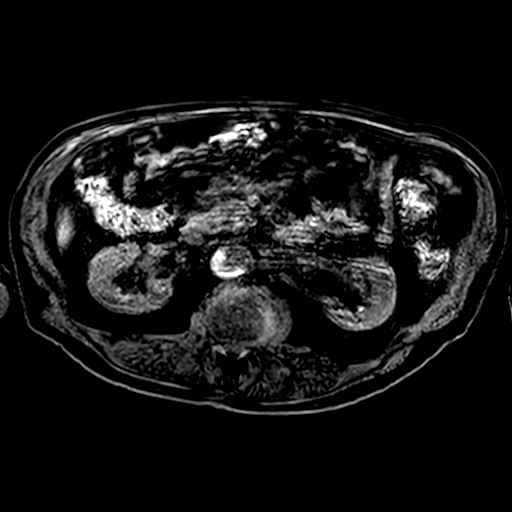
[im 120/120]
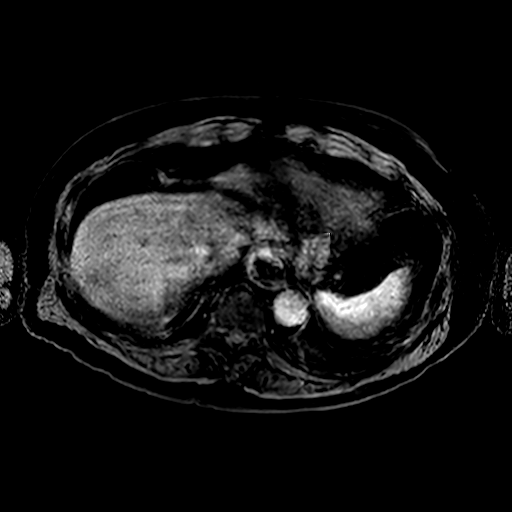

[Series 703: DIXON · axial · 4.0mm · 0.84mm/px · z∈[-61,+177]mm · 3 of 120 slices shown (2 of 5)]
[im 1/120]
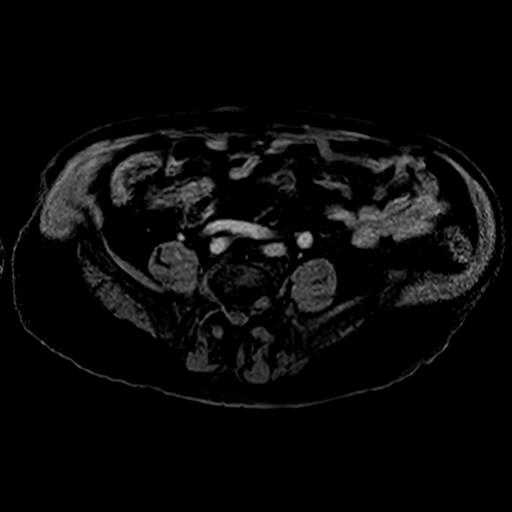
[im 60/120]
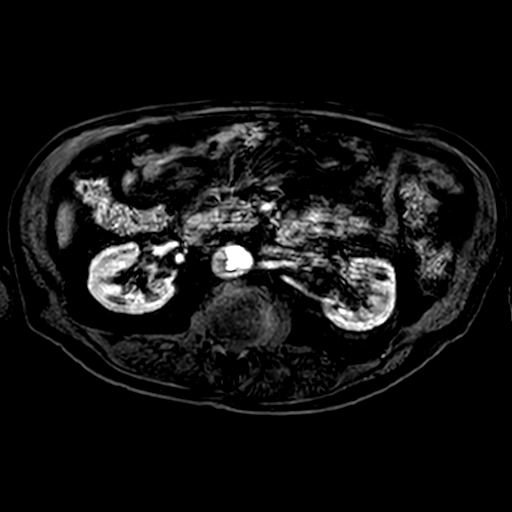
[im 120/120]
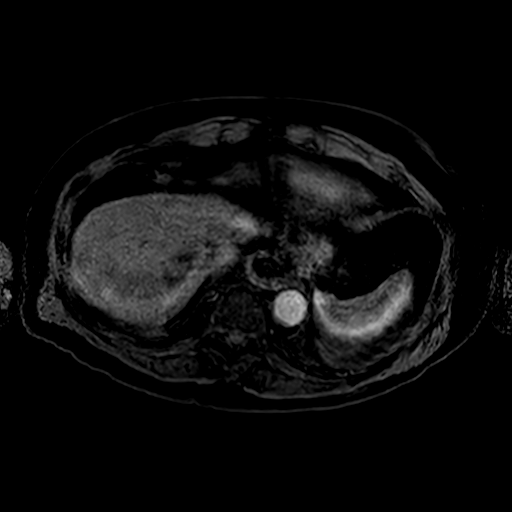

[Series 704: DIXON · axial · 4.0mm · 0.84mm/px · z∈[-61,+177]mm · 3 of 120 slices shown (3 of 5)]
[im 1/120]
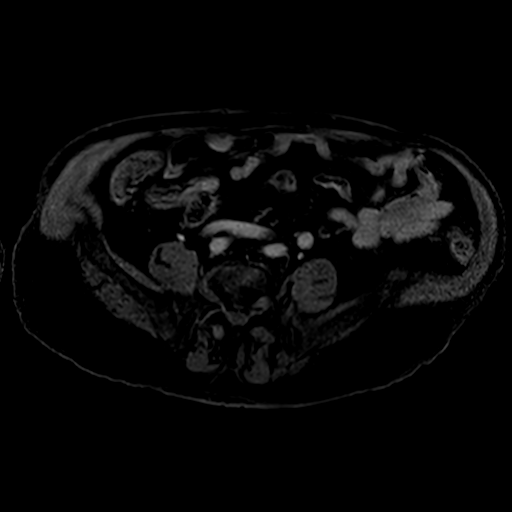
[im 60/120]
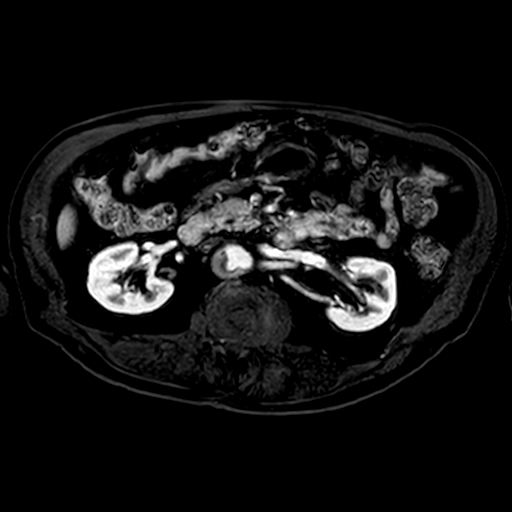
[im 120/120]
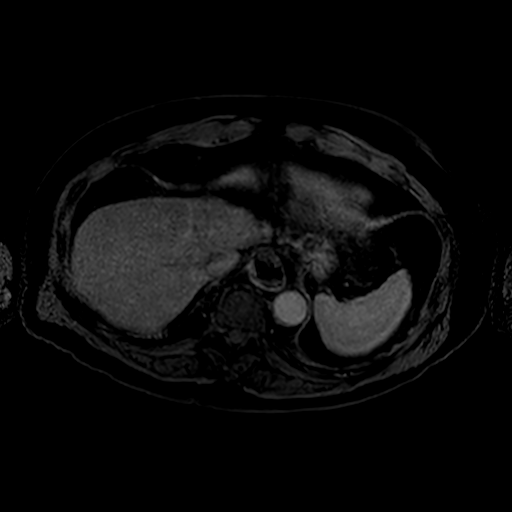

[Series 705: DIXON · axial · 4.0mm · 0.84mm/px · z∈[-61,+177]mm · 3 of 120 slices shown (4 of 5)]
[im 1/120]
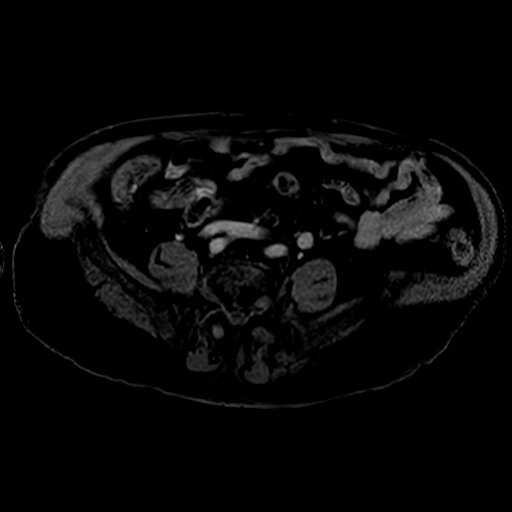
[im 60/120]
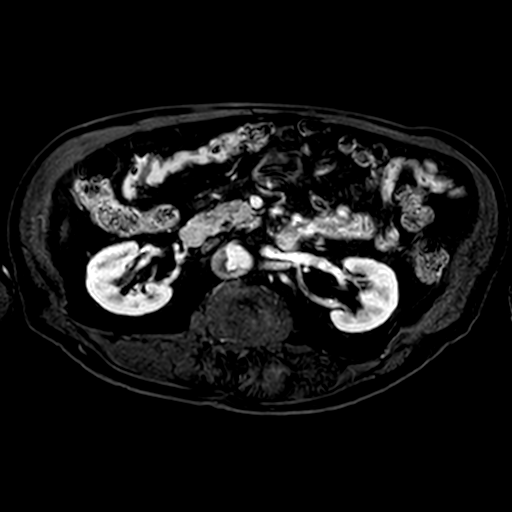
[im 120/120]
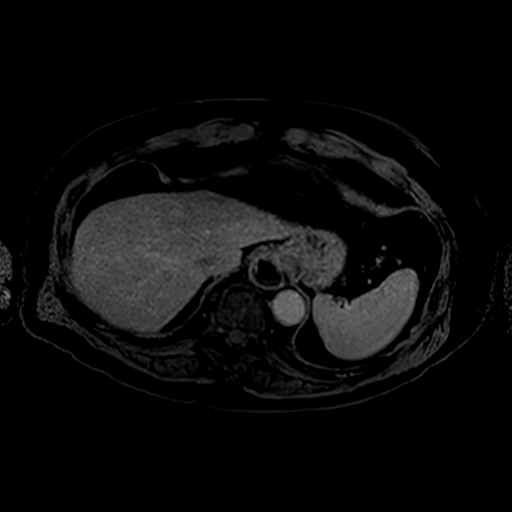

[Series 706: DIXON · axial · 4.0mm · 0.84mm/px · 1 of 120 slices shown (5 of 5)]
[im 1/120]
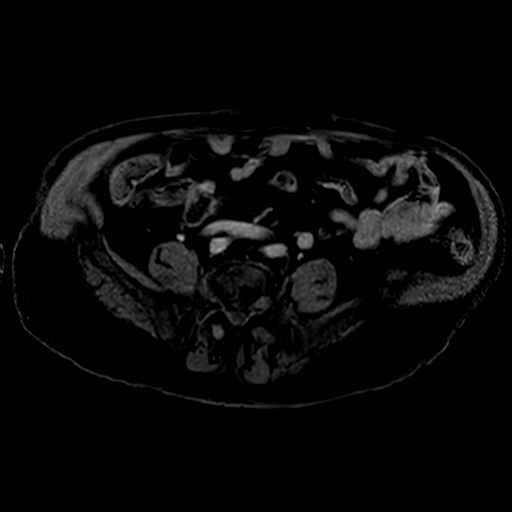

[22 of 48 positions shown; findings below may reference images not displayed]

FINDINGS: Stable 9 to 10 mm enhancing nodule anteriorly off the midportion of 
the left kidney. This has not changed since October 2021 exam. No new mass 
identified within the kidneys. No hydronephrosis identified. Simple cyst seen on 
the right. 
Known abdominal aortic aneurysm better evaluated on the CTA from April 2022. 
Small hiatal hernia. 
Liver, spleen, pancreas and adrenal glands are unremarkable in appearance. 
Gallbladder is not distended.
IMPRESSION: Stable enhancing left renal lesion since the MRI from October 2021.

## 2022-11-09 IMAGING — CT CT ABDOMEN PELVIS W/ UROGRAM
2 of 6 series · 14 of 46 positions shown, 16 images · IV contrast (APPLIED)
Comparison: 04/28/2022 CTA 
Count of known CT and Cardiac Nuclear Medicine studies performed in the previous 
12 months = 0.

________________________________________________________________________________________________ 
CT ABDOMEN PELVIS W/ UROGRAM, 11/09/2022 [DATE]: 
CLINICAL INDICATION: Calculus Of Kidney. Hematuria, Unspecified 
A search for DICOM formatted images was conducted for prior CT imaging studies 
completed at a non-affiliated media free facility.
TECHNIQUE: The region of interest was scanned without and with 75 mL of Isovue 
300 MDV were injected intravenously on a high resolution CT scanner using dose 
reduction techniques. Routine MPR reconstructions were performed. The patients 
eGFR was calculated to be 64.6 mL/min/1.73 m2 using the i-STAT device.

[Series 5: portal · axial · portal-venous · 0.80mm/px · z∈[-744,-318]mm · 11 of 172 slices shown, 13 images]
[im 15/172  soft-tissue]
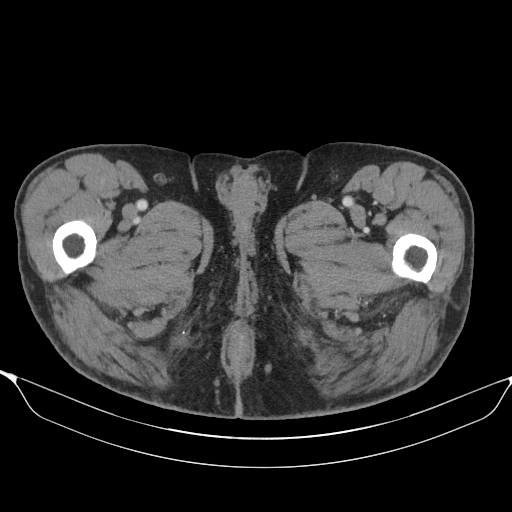
[im 15/172  bone]
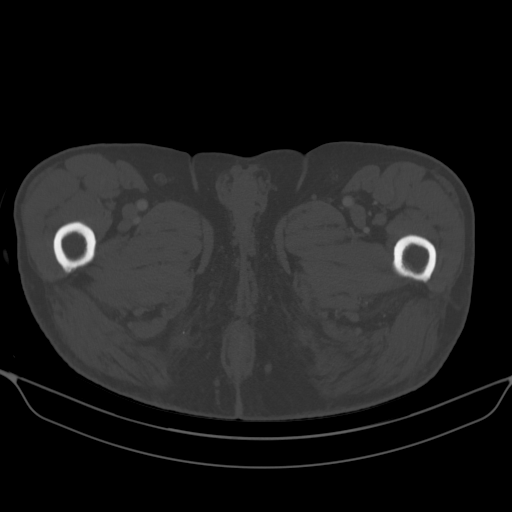
[im 29/172  soft-tissue]
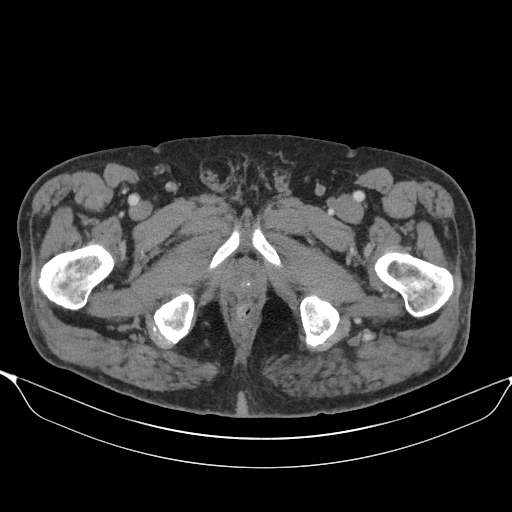
[im 43/172  soft-tissue]
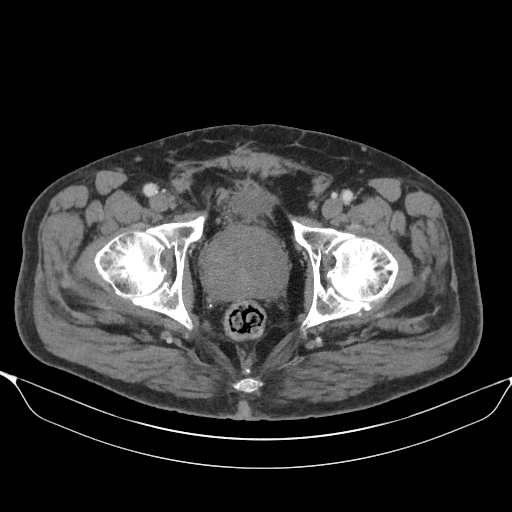
[im 58/172  soft-tissue]
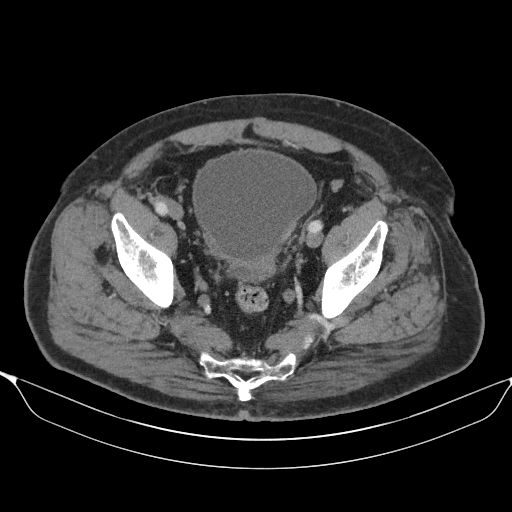
[im 72/172  soft-tissue]
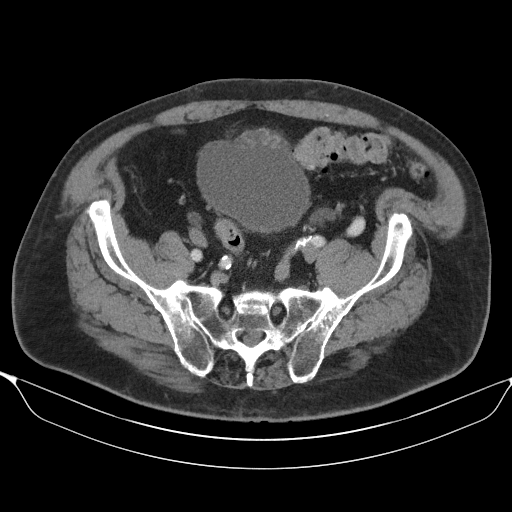
[im 86/172  soft-tissue]
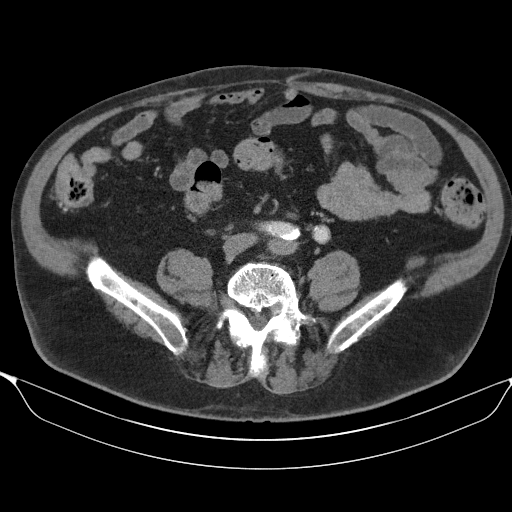
[im 100/172  soft-tissue]
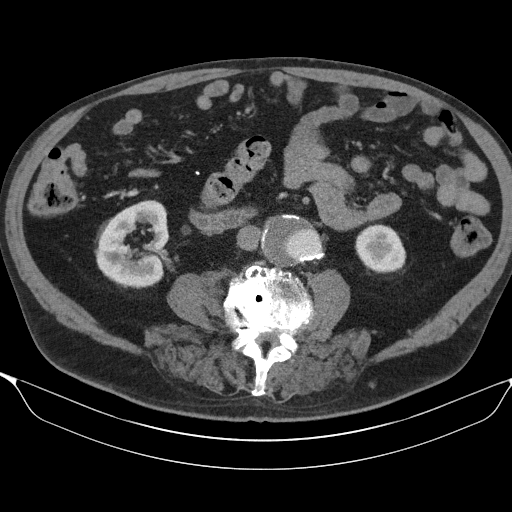
[im 115/172  soft-tissue]
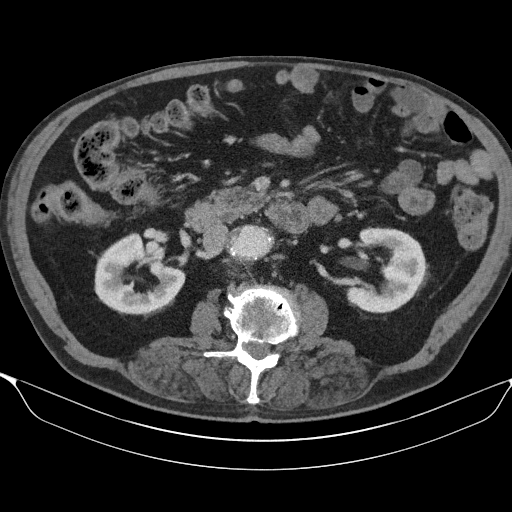
[im 129/172  soft-tissue]
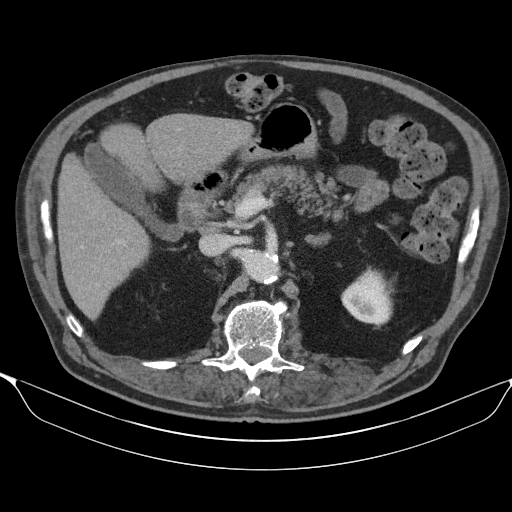
[im 129/172  bone]
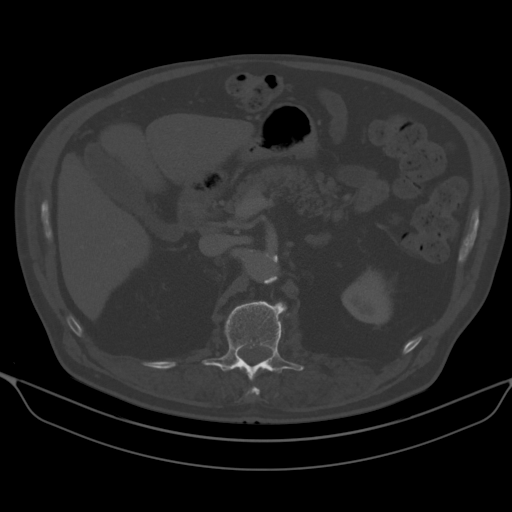
[im 143/172  soft-tissue]
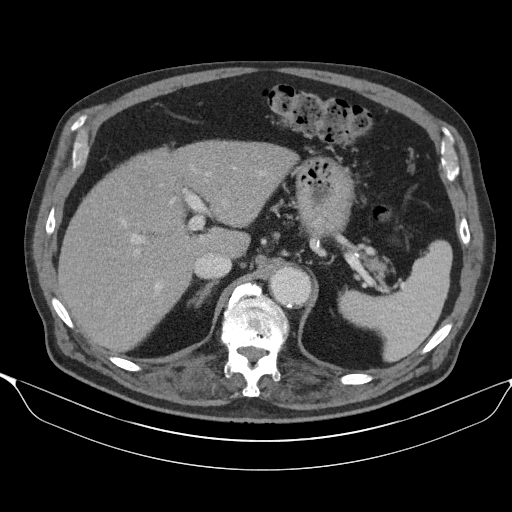
[im 157/172  soft-tissue]
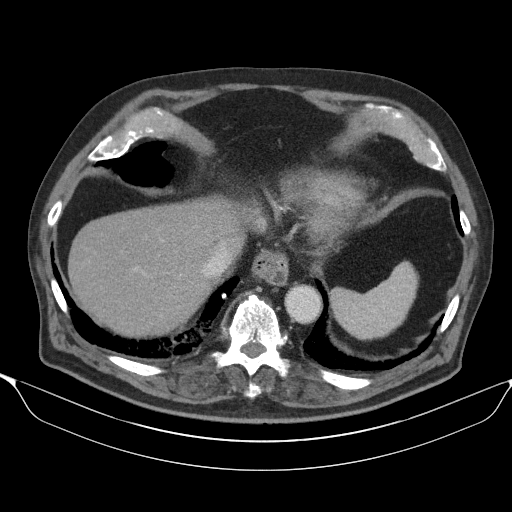

[Series 6: coronal · coronal · 1.01mm/px · 3 of 161 slices shown]
[im 41/161  soft-tissue]
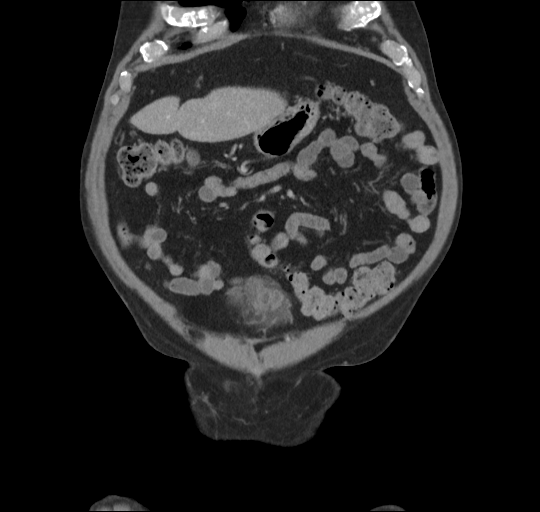
[im 81/161  soft-tissue]
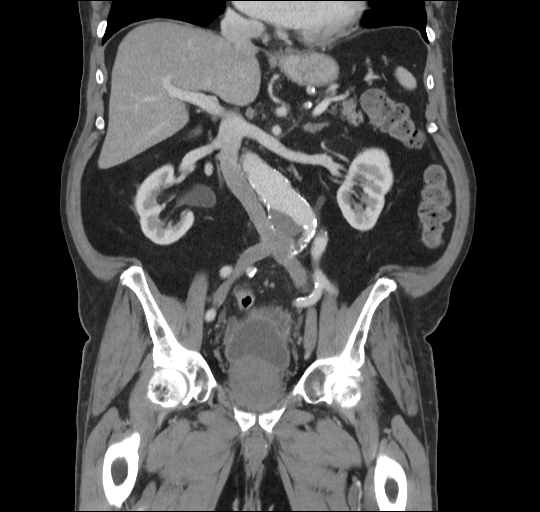
[im 121/161  soft-tissue]
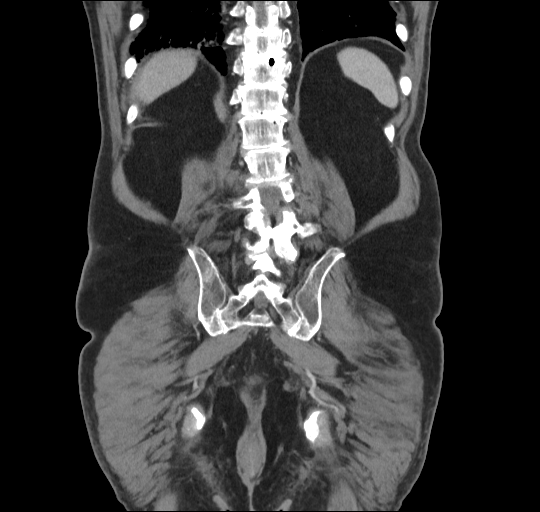

[14 of 46 positions shown; findings below may reference images not displayed]

FINDINGS: KIDNEYS: No masses.  No hydronephrosis.  0.5 cm nonobstructing right upper pole 
renal calculus. 
URETERS: Normal caliber without filling defects.   
BLADDER: 1.2 and 0.8 cm posterior bladder calculi (8615, 7906 HU). Multifocal 
bladder trabeculation with adjacent mild wall thickening and increased 
enhancement, most marked posteriorly and near the dome. No mass.  Normal wall 
thickness. 
PROSTATE: Prostatomegaly (5.6 x 6.7 cm in cross-section). Prostatic 
calcifications. 
LUNG BASES: Mild cylindrical bronchiectasis and atelectasis.  
HEPATOBILIARY: No mass or biliary dilatation. No gallstones. 
SPLEEN: Within normal limits. 
PANCREAS: No mass.  No pancreatic fluid collections. 
ADRENALS: No masses. 
LYMPH NODES: No adenopathy. 
STOMACH, SMALL BOWEL AND COLON: Small hiatal hernia. No bowel wall thickening or 
obstruction. 
VASCULAR STRUCTURES: Saccular infrarenal abdominal aortic aneurysm measures
cm in AP dimensions. Fusiform distal abdominal aortic aneurysm measures 4.0 cm 
in AP dimensions. Atherosclerosis. 
MUSCULOSKELETAL: Tiny fat-containing umbilical hernia. Degenerative change and 
mild levoscoliosis.
IMPRESSION: 1.  0.5 cm nonobstructing right upper pole renal calculus. 
2.  1.2 and 0.8 cm posterior bladder calculi and bladder trabeculation with 
adjacent mild wall thickening/enhancement.  
3.  Prostatomegaly. 
4.  4.5 cm saccular infrarenal abdominal aortic aneurysm, 4.0 cm fusiform distal 
abdominal aortic aneurysm and atherosclerosis. 
5.  Small hiatal hernia and tiny fat-containing umbilical hernia.  
6.  Mild cylindrical bronchiectasis and atelectasis.  
7.  Degenerative change and mild levoscoliosis. 
RADIATION DOSE REDUCTION: All CT scans are performed using radiation dose 
reduction techniques, when applicable.  Technical factors are evaluated and 
adjusted to ensure appropriate moderation of exposure.  Automated dose 
management technology is applied to adjust the radiation doses to minimize 
exposure while achieving diagnostic quality images.

## 2023-02-08 IMAGING — CT CTA ABDOMEN AND PELVIS
2 of 3 series · 14 of 46 positions shown, 16 images · IV contrast (APPLIED)
Comparison: Count of known CT and Cardiac Nuclear Medicine studies performed in the previous 
12 months = 2.

________________________________________________________________________________________________ 
CTA ABDOMEN AND PELVIS, 02/08/2023 [DATE]: 
CLINICAL INDICATION: Abdominal Aortic Aneurysm, Without Rupture, Unspecified . 
A search for DICOM formatted images was conducted for prior CT imaging studies 
completed at a non-affiliated media free facility.
TECHNIQUE: The abdomen and pelvis was scanned from lung bases through the pubic 
rami with 100 mL of Isovue 370 MDV injected intravenously on a high resolution 
low dose CT scanner using dose reduction techniques. Routine MPR and MIP 3D 
renderings were reconstructed on an independent workstation with concurrent 
physician supervision. The patients eGFR was calculated to be 72.1 mL/min/1.73 
m2 using the i-STAT device.

[Series 4: axial · axial · 0.88mm/px · z∈[-541,-98]mm · 11 of 509 slices shown, 13 images]
[im 33/509  soft-tissue]
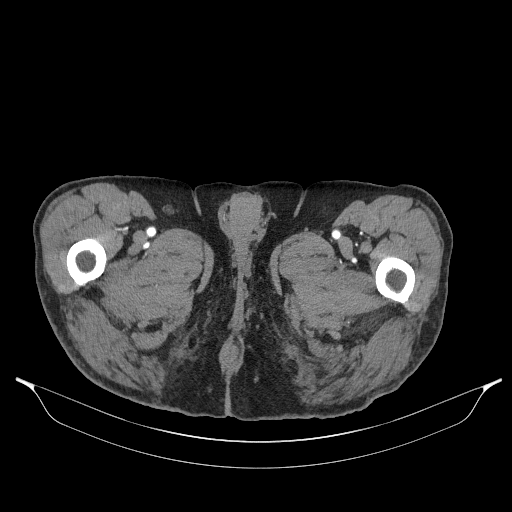
[im 33/509  bone]
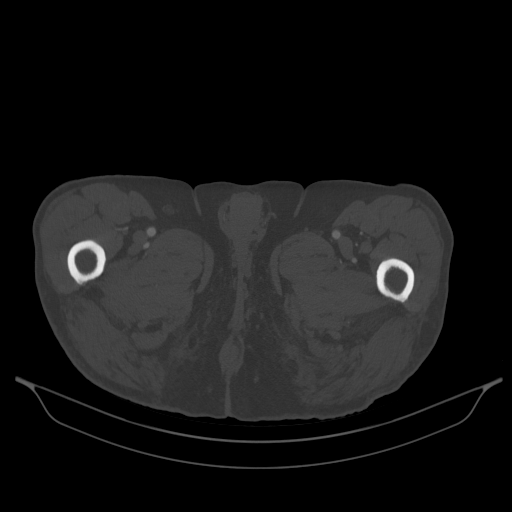
[im 82/509  soft-tissue]
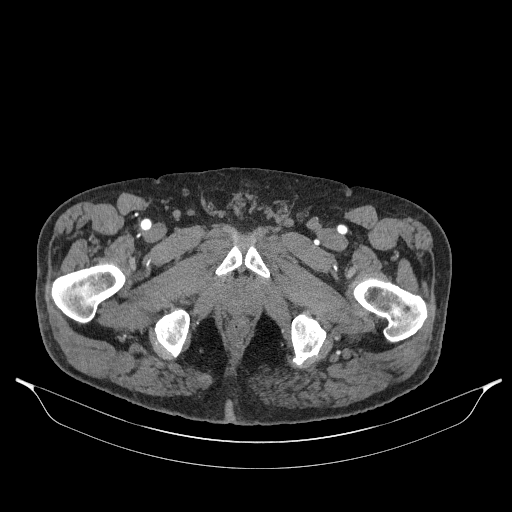
[im 115/509  soft-tissue]
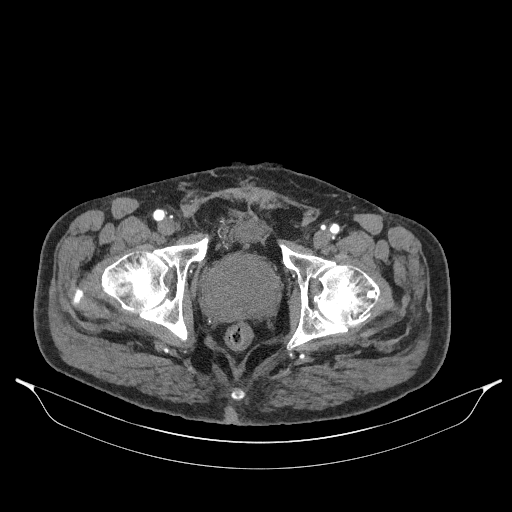
[im 164/509  soft-tissue]
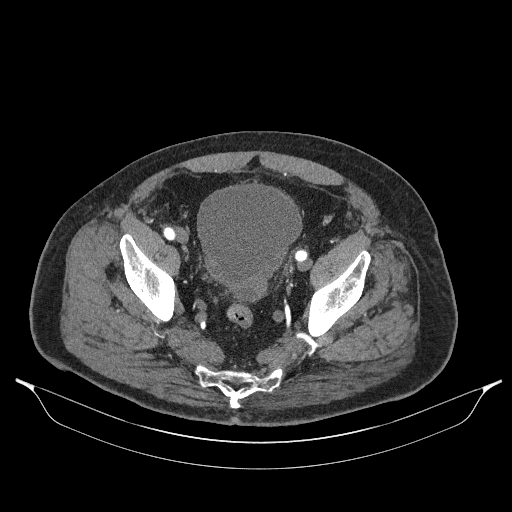
[im 214/509  soft-tissue]
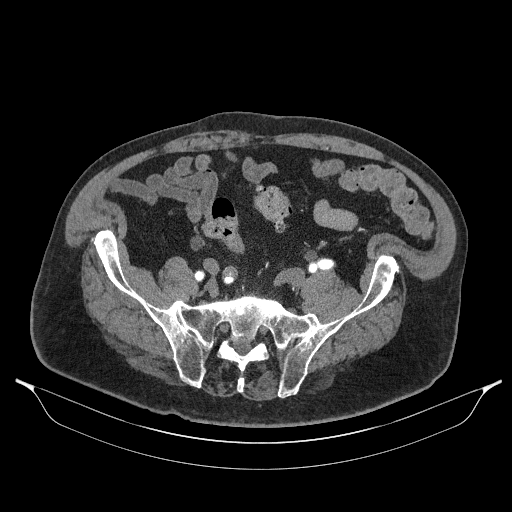
[im 263/509  soft-tissue]
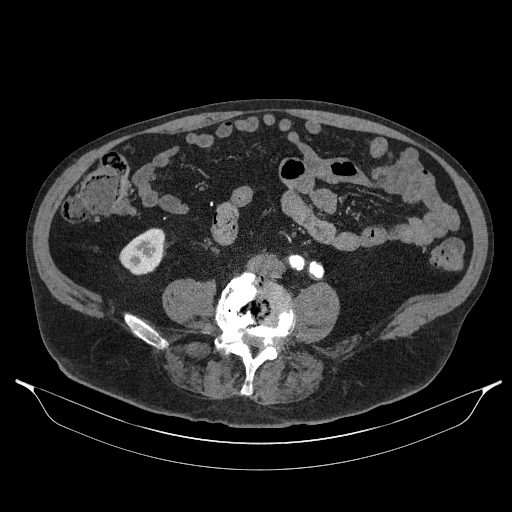
[im 295/509  soft-tissue]
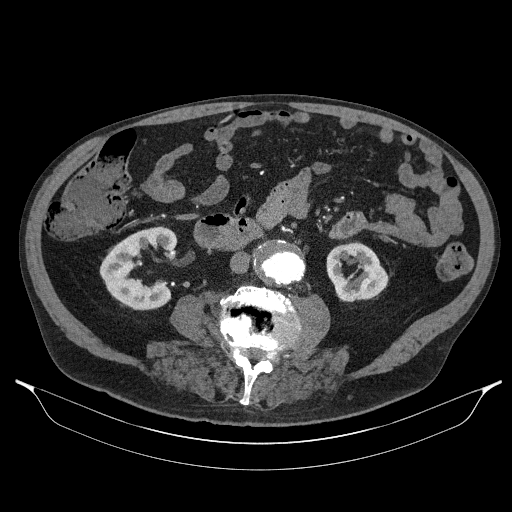
[im 345/509  soft-tissue]
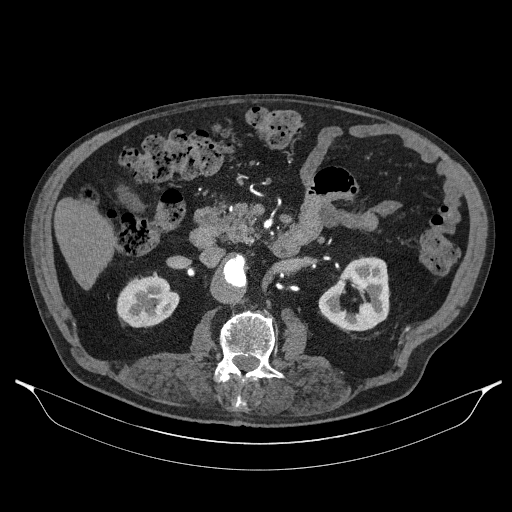
[im 394/509  soft-tissue]
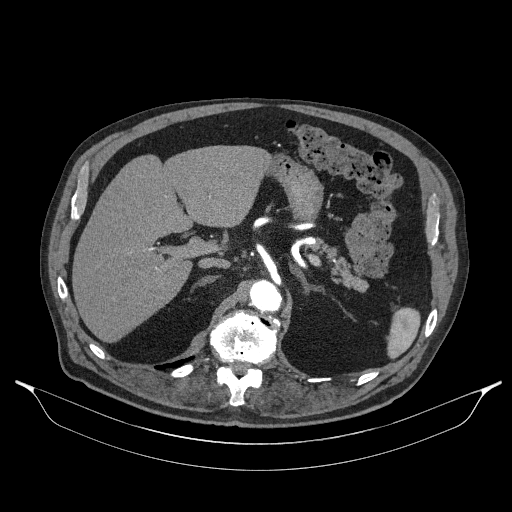
[im 394/509  bone]
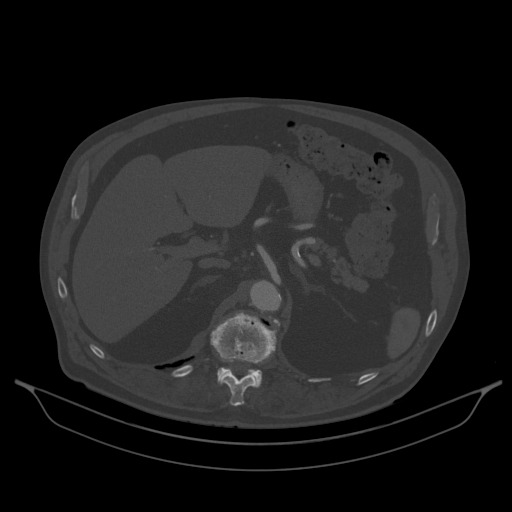
[im 427/509  soft-tissue]
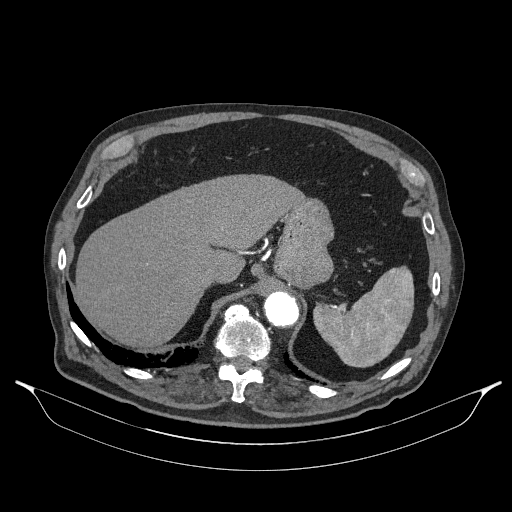
[im 476/509  soft-tissue]
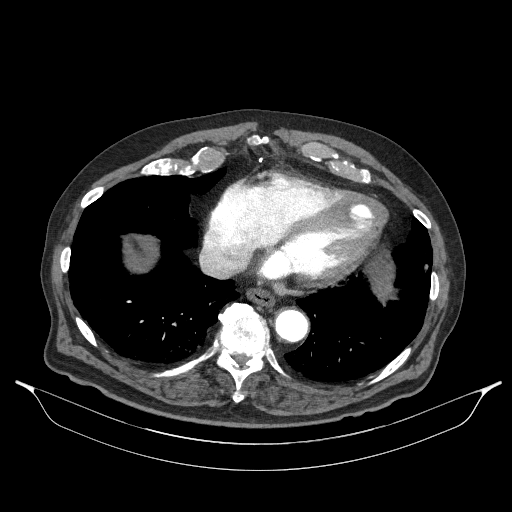

[Series 5: cor cor · coronal · 0.81mm/px · 3 of 301 slices shown]
[im 101/301  soft-tissue]
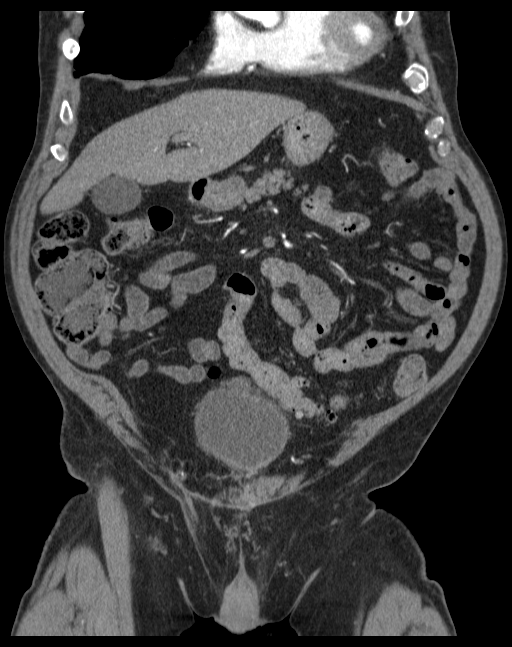
[im 134/301  soft-tissue]
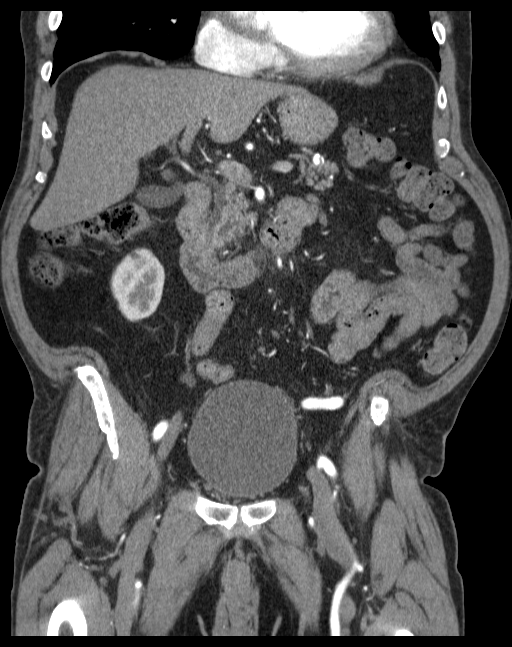
[im 167/301  soft-tissue]
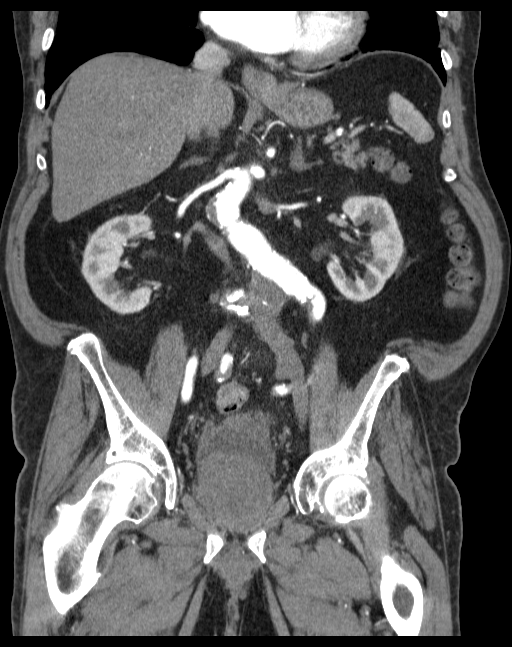

[14 of 46 positions shown; findings below may reference images not displayed]

FINDINGS: ABDOMINAL AORTA: Is significantly tortuous. There is an aneurysm which begins 
just at the inferior aspect of the takeoff of the left renal artery. On coronal 
image 175 measures 3.1 cm. Second aneurysm seen more inferiorly best measured on 
coronal image 161 at 4.2 cm just above the aortic bifurcation. Does not involve 
the bifurcation. 
MESENTERIC ARTERIES: There is significant stenosis of the origin of the celiac 
artery. The superior mesenteric artery is widely patent as are the renal 
arteries. The inferior mesenteric artery is widely patent though lies between 
the 2 aneurysms. 
RENAL ARTERIES: No significant renal artery stenosis or aneurysm. 
ILIOFEMORAL VESSELS: Common iliac arteries measure 11 mm on right and 10 mm on 
the left. Tortuous especially on the right. Mild aneurysmal dilatation of the 
right internal iliac artery at 1.3 cm. Very tortuous left external iliac artery. 
LUNG BASES: Mild bronchiectasis seen in both lower lobes. No suspicious mass. No 
pleural effusion seen 
LIVER: No mass.  No intrahepatic biliary dilatation. 
GALLBLADDER: No wall thickening.  No stones. 
COMMON BILE DUCT: Normal caliber.  No stones. 
SPLEEN: Within normal limits. 
PANCREAS: No mass.  No pancreatic fluid collections. 
ADRENALS: Stable 1.5 cm right adrenal adenoma since 1611. 
KIDNEYS: 5 mm nonobstructing right renal calculus.  No hydronephrosis. 
LYMPH NODES: No adenopathy. 
STOMACH, SMALL BOWEL AND COLON: No bowel wall thickening or obstruction. 
PERITONEAL CAVITY: No mesenteric stranding or free fluid. 
OSSEOUS STRUCTURES: No acute fracture or destructive lesion. 
PELVIC ORGANS: Multiple bladder diverticula are identified. This is seen 
especially up in the dome where there is enhancement of the mucosa within the 
diverticulum. I do not see discrete mass. The prostate gland is significantly 
enlarged elevating the bladder base.
IMPRESSION: 2 separate aneurysms involving the abdominal aorta 1 beginning just below the 
left renal artery and the second more distally as above. There is a tortuous 
abdominal aorta, right common iliac and left external iliac arteries. Small 
aneurysm also seen involving the right internal iliac artery. 
Degenerative changes. Nonobstructing right renal calculus. Stable incidental 
right adrenal adenoma. 
Significant stenosis of the origin of the celiac artery. Please see discussion 
above. 
Multiple bladder diverticula with enhancing mucosa as above. 
RADIATION DOSE REDUCTION: All CT scans are performed using radiation dose 
reduction techniques, when applicable.  Technical factors are evaluated and 
adjusted to ensure appropriate moderation of exposure.  Automated dose 
management technology is applied to adjust the radiation doses to minimize 
exposure while achieving diagnostic quality images.

## 2023-06-04 IMAGING — CT CTA CHEST ABDOMEN AND PELVIS
2 of 5 series · 12 of 46 positions shown, 14 images · IV contrast (APPLIED)
Comparison: 02/08/2023 CT abdomen and pelvis   
Count of known CT and Cardiac Nuclear Medicine studies performed in the previous 
12 months = 2.

________________________________________________________________________________________________ 
CTA CHEST ABDOMEN AND PELVIS, 06/04/2023 [DATE]: 
CLINICAL INDICATION: Abdominal Aortic Aneurysm, Without Rupture, Unspecified 
A search for DICOM formatted images was conducted for prior CT imaging studies 
completed at a non-affiliated media free facility.
TECHNIQUE: The chest,abdomen and pelvis were scanned from base of neck through 
the pubic rami with 100 mL of Isovue 370 MDV injected intravenously on a high 
resolution low dose CT scanner using dose reduction techniques. Routine MPR and 
MIP 3D renderings were reconstructed on an independent workstation with 
concurrent physician supervision. The patients eGFR was calculated to be
mL/min/1.73 m2 using the i-STAT device.

[Series 5: axial · axial · 0.85mm/px · z∈[-491,+31]mm · 9 of 327 slices shown, 11 images]
[im 33/327  soft-tissue]
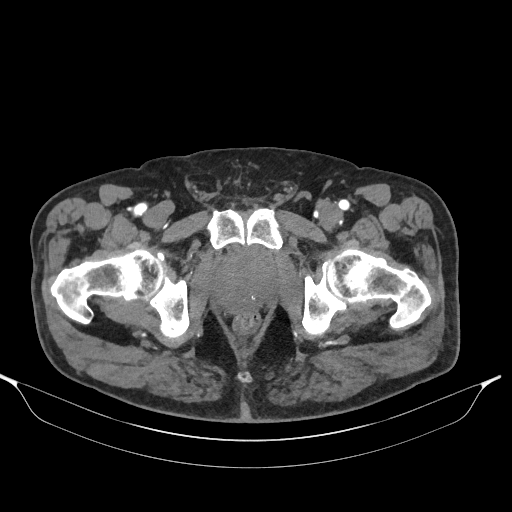
[im 33/327  bone]
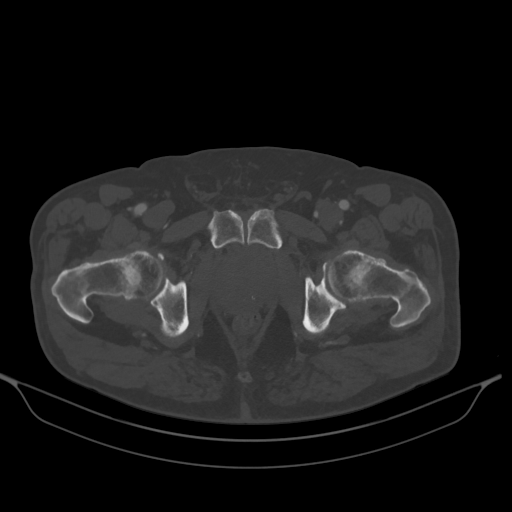
[im 66/327  soft-tissue]
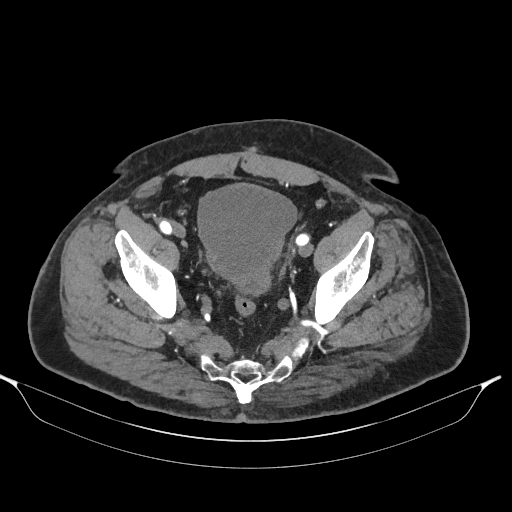
[im 98/327  soft-tissue]
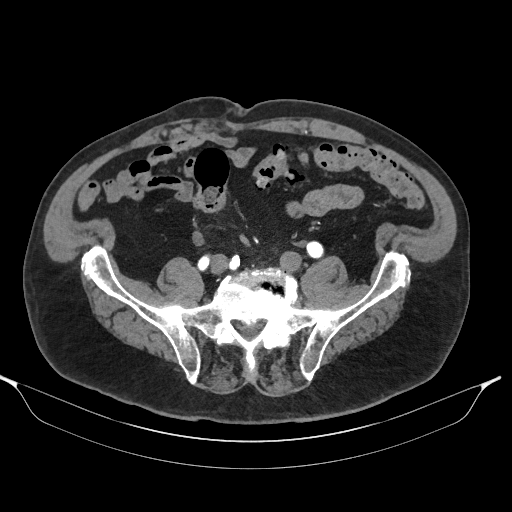
[im 131/327  soft-tissue]
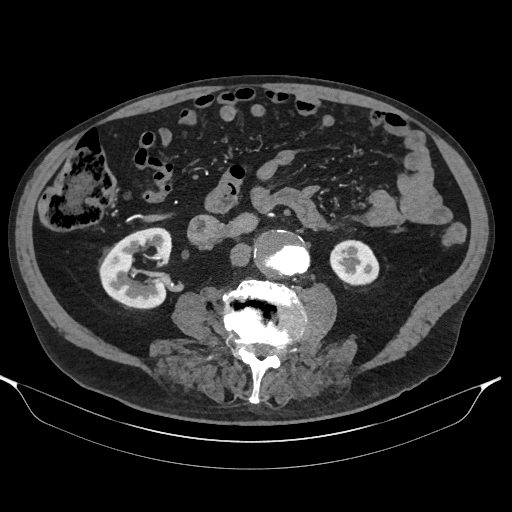
[im 164/327  soft-tissue]
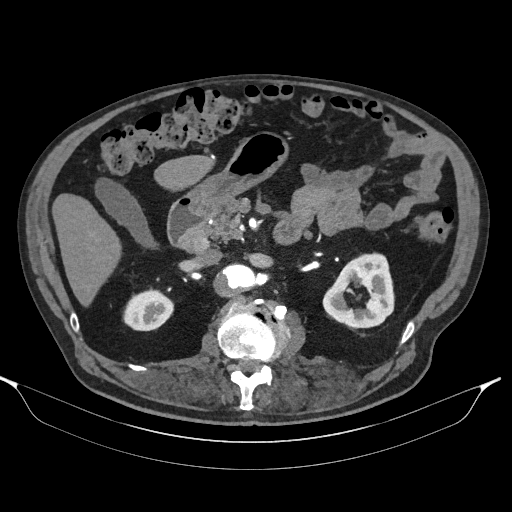
[im 196/327  soft-tissue]
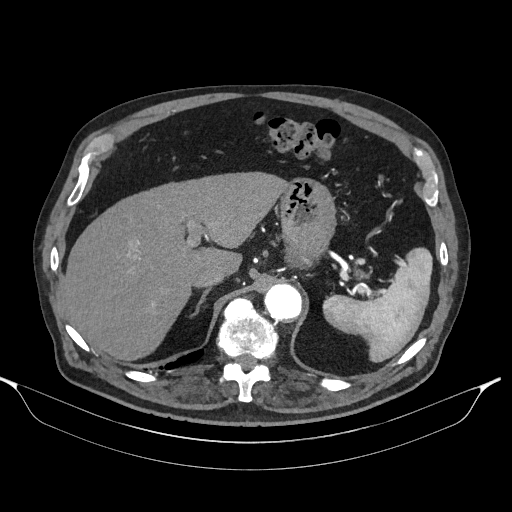
[im 229/327  soft-tissue]
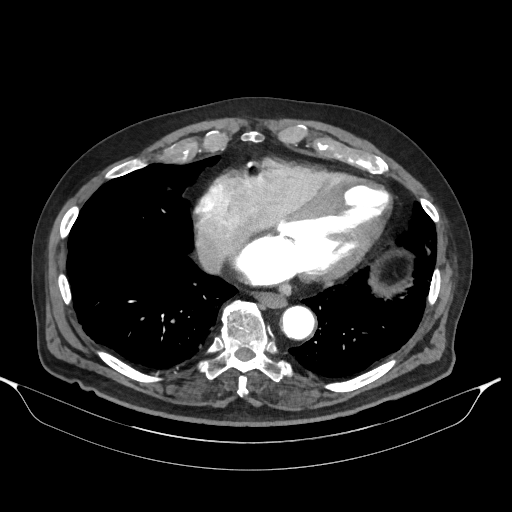
[im 261/327  soft-tissue]
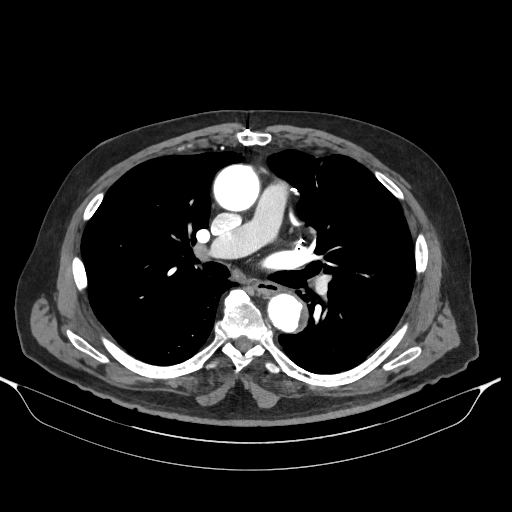
[im 294/327  soft-tissue]
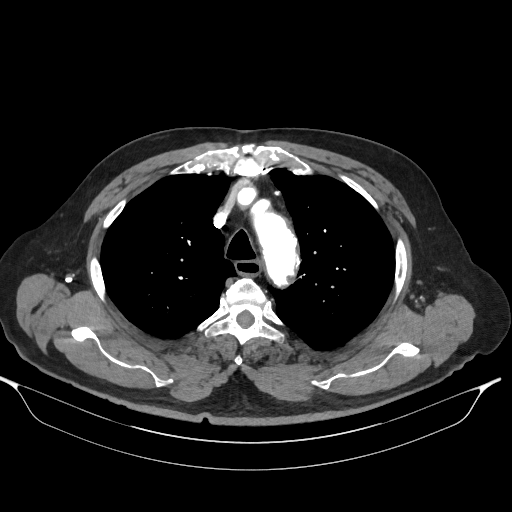
[im 294/327  bone]
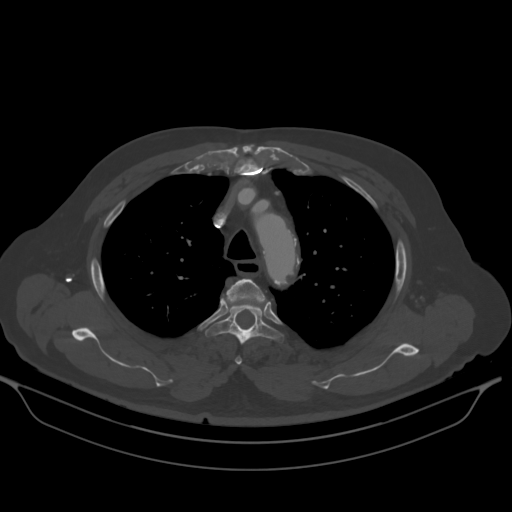

[Series 6: cor cor · coronal · 0.77mm/px · 3 of 144 slices shown]
[im 48/144  soft-tissue]
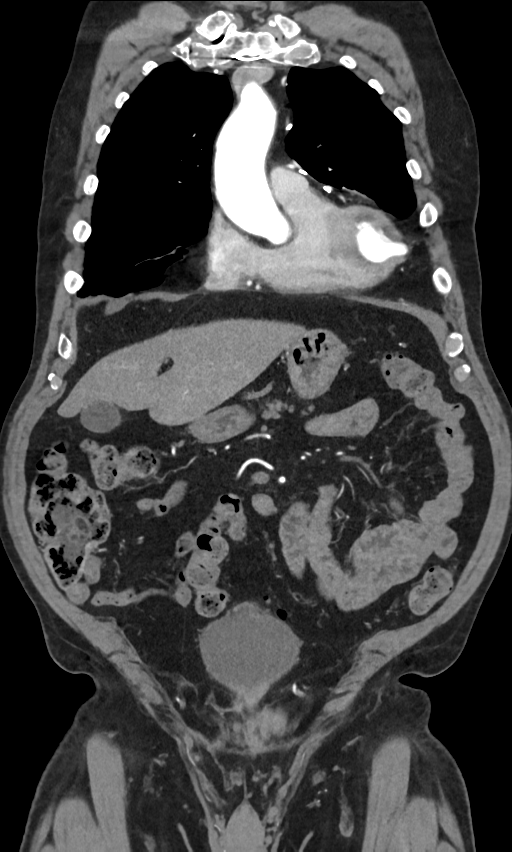
[im 64/144  soft-tissue]
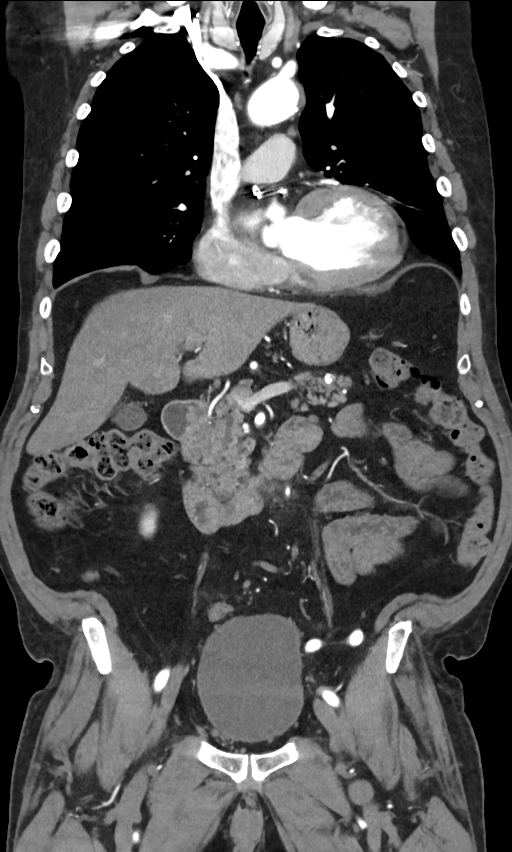
[im 80/144  soft-tissue]
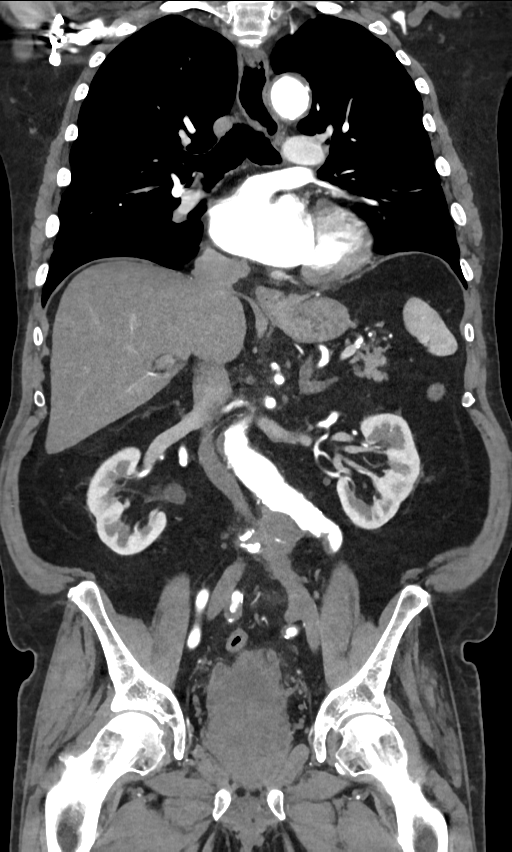

[12 of 46 positions shown; findings below may reference images not displayed]

FINDINGS: PULMONARY ARTERIES: No pulmonary embolus. 
LUNGS AND PLEURA: Mild stable bronchiectatic changes and scarring/fibrosis in 
the lower lobes and cannot exclude early interstitial pulmonary fibrosis. 5 mm x 
3 mm nodular density in the left upper lobe as seen on axial image #65 of series 
#9. Minor linear atelectasis versus scarring in the lingula. No consolidations 
in the lungs. No effusions.   
MEDIASTINUM: No masses.  
LYMPH NODES: No adenopathy. 
HEART: Previous open heart surgery with severe coronary calcifications. Normal 
in size.  No pericardial effusion.  
AORTA AND GREAT VESSELS: No aneurysm or dissection. 
ABDOMINAL AORTA: There is a fusiform appearing aneurysm with mural thrombus 
beginning just below the level of the left renal artery and measuring up to
cm on coronal image #60 of series a 6 was previously reported as 3.1 cm. This 
also a second fusiform infrarenal abdominal aortic aneurysm seen more inferiorly 
measuring 4.3 cm on coronal image #77 series #6] measuring 4.2 cm. 
MESENTERIC ARTERIES: Stable moderate stenosis at the origin of the celiac 
artery. No stenosis at the origin of the superior mesenteric artery with a 
patent inferior mesenteric artery arising between the aneurysms. 
RENAL ARTERIES: No significant renal artery stenosis or aneurysm. 
ILIOFEMORAL VESSELS: Common iliac arteries are tortuous measure 11 mm on right 
and 12 mm on the left. No significant iliofemoral vessel stenosis or aneurysm. 
There is also aneurysmal dilatation of the right internal iliac artery measuring 
1.1 cm with mural thrombus and unchanged. 
LIVER: No mass.  No intrahepatic biliary dilatation. 
GALLBLADDER: No wall thickening.  No stones. 
COMMON BILE DUCT: Normal caliber.  No stones. 
SPLEEN: Within normal limits. 
PANCREAS: No mass.  No pancreatic fluid collections. 
ADRENALS: Stable up to 1.5 cm right adrenal adenoma. 
KIDNEYS: No masses.  No hydronephrosis. 
LYMPH NODES: No adenopathy. 
STOMACH, SMALL BOWEL AND COLON: No bowel wall thickening or obstruction. Small 
esophageal hiatal hernia. Stomach is decompressed and difficult to assess. 
Diverticulosis of the sigmoid colon without active diverticulitis. 
PERITONEAL CAVITY: No mesenteric stranding or free fluid. 
OSSEOUS STRUCTURES: No acute fracture or destructive lesion. Degenerative 
changes in the lumbar spine present as well as syndesmophyte formation in the 
thoracic spine. 
PELVIC ORGANS: Enlarged prostate indenting the base of urinary bladder measuring 
6.4 x 5.6 x 6.7 cm with a volume of 137 cc with diverticuli posterior aspect of 
the urinary bladder as well as superior anterior urinary bladder with some 
associated trabeculation. These findings are unchanged compared to previous CT.
IMPRESSION: There is a fusiform appearing aneurysm with mural thrombus beginning just below 
the level of the left renal artery and measuring up to 3.3 cm on coronal image 
#60 of series a 6 was previously reported as 3.1 cm. This also a second fusiform 
infrarenal abdominal aortic aneurysm seen more inferiorly measuring 4.3 cm on 
coronal image #77 series #6] measuring 4.2 cm.. 
No thoracic aortic aneurysm. 
Tortuous common iliac arteries with stable 1.1 cm fusiform aneurysm of the right 
internal iliac artery. 
Enlarged prostate indenting the base of urinary bladder measuring 6.4 x 5.6 x 
6.7 cm with a volume of 137 cc with diverticuli posterior aspect of the urinary 
bladder as well as superior anterior urinary bladder with some associated 
trabeculation. These findings are unchanged compared to previous CT. 
Small esophageal hiatal hernia. Stomach is decompressed and difficult to assess. 
Diverticulosis of the sigmoid colon without active diverticulitis. 
In patients between the ages of 50-77 where pulmonary emphysema is noted on CT, 
recommend evaluation for low dose lung cancer screening protocol if patient is 
not already enrolled; as pulmonary emphysema is an independent risk factor for 
lung cancer. 
RADIATION DOSE REDUCTION: All CT scans are performed using radiation dose 
reduction techniques, when applicable.  Technical factors are evaluated and 
adjusted to ensure appropriate moderation of exposure.  Automated dose 
management technology is applied to adjust the radiation doses to minimize 
exposure while achieving diagnostic quality images.
